# Patient Record
Sex: Female | Born: 1985 | Race: White | Hispanic: No | Marital: Single | Smoking: Current every day smoker
Health system: Southern US, Community
[De-identification: ages and names within clinical notes are randomized; demographics above are authoritative.]

## PROBLEM LIST (undated history)

## (undated) DIAGNOSIS — F419 Anxiety disorder, unspecified: Secondary | ICD-10-CM

## (undated) DIAGNOSIS — F32A Depression, unspecified: Secondary | ICD-10-CM

## (undated) DIAGNOSIS — F329 Major depressive disorder, single episode, unspecified: Secondary | ICD-10-CM

## (undated) DIAGNOSIS — I10 Essential (primary) hypertension: Secondary | ICD-10-CM

## (undated) DIAGNOSIS — F4310 Post-traumatic stress disorder, unspecified: Secondary | ICD-10-CM

## (undated) DIAGNOSIS — F909 Attention-deficit hyperactivity disorder, unspecified type: Secondary | ICD-10-CM

## (undated) DIAGNOSIS — F319 Bipolar disorder, unspecified: Secondary | ICD-10-CM

## (undated) HISTORY — PX: APPENDECTOMY: SHX54

## (undated) HISTORY — PX: CHOLECYSTECTOMY: SHX55

## (undated) HISTORY — DX: Essential (primary) hypertension: I10

---
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
## 2019-04-05 HISTORY — PX: WRIST FRACTURE SURGERY: SHX121

## 2019-07-15 ENCOUNTER — Emergency Department (HOSPITAL_COMMUNITY)
Admission: EM | Admit: 2019-07-15 | Discharge: 2019-07-15 | Disposition: A | Discharge status: Home / Self Care | Payer: Medicaid Other | Attending: Emergency Medicine | Admitting: Emergency Medicine

## 2019-07-15 ENCOUNTER — Encounter (HOSPITAL_COMMUNITY): Payer: Self-pay | Admitting: Emergency Medicine

## 2019-07-15 ENCOUNTER — Other Ambulatory Visit: Payer: Self-pay

## 2019-07-15 DIAGNOSIS — Z760 Encounter for issue of repeat prescription: Secondary | ICD-10-CM | POA: Diagnosis not present

## 2019-07-15 DIAGNOSIS — Z5321 Procedure and treatment not carried out due to patient leaving prior to being seen by health care provider: Secondary | ICD-10-CM | POA: Insufficient documentation

## 2019-07-15 HISTORY — DX: Anxiety disorder, unspecified: F41.9

## 2019-07-15 HISTORY — DX: Attention-deficit hyperactivity disorder, unspecified type: F90.9

## 2019-07-15 HISTORY — DX: Post-traumatic stress disorder, unspecified: F43.10

## 2019-07-15 HISTORY — DX: Depression, unspecified: F32.A

## 2019-07-15 NOTE — ED Triage Notes (Signed)
Pt. Stated, I need RX refill for Alteral, Xanax, Proozac. Ive had to cross over states and they don't cross over.

## 2019-07-15 NOTE — ED Notes (Signed)
Pt called out stating she has a curfew at the shelter. Pt advised the provider will be in as soon as they can. Pt stated she will wait for now.

## 2019-08-14 ENCOUNTER — Ambulatory Visit: Payer: Medicaid Other | Attending: Internal Medicine

## 2019-08-14 ENCOUNTER — Other Ambulatory Visit: Payer: Self-pay

## 2019-08-14 DIAGNOSIS — Z20822 Contact with and (suspected) exposure to covid-19: Secondary | ICD-10-CM

## 2019-08-15 LAB — SARS-COV-2, NAA 2 DAY TAT

## 2019-08-15 LAB — NOVEL CORONAVIRUS, NAA: SARS-CoV-2, NAA: NOT DETECTED

## 2019-08-26 ENCOUNTER — Emergency Department (HOSPITAL_COMMUNITY): Payer: Medicaid Other

## 2019-08-26 ENCOUNTER — Encounter (HOSPITAL_COMMUNITY): Payer: Self-pay | Admitting: *Deleted

## 2019-08-26 ENCOUNTER — Emergency Department (HOSPITAL_COMMUNITY)
Admission: EM | Admit: 2019-08-26 | Discharge: 2019-08-26 | Disposition: A | Discharge status: Home / Self Care | Payer: Medicaid Other | Attending: Emergency Medicine | Admitting: Emergency Medicine

## 2019-08-26 ENCOUNTER — Other Ambulatory Visit: Payer: Self-pay

## 2019-08-26 DIAGNOSIS — J069 Acute upper respiratory infection, unspecified: Secondary | ICD-10-CM | POA: Diagnosis not present

## 2019-08-26 DIAGNOSIS — Z20822 Contact with and (suspected) exposure to covid-19: Secondary | ICD-10-CM | POA: Insufficient documentation

## 2019-08-26 DIAGNOSIS — R05 Cough: Secondary | ICD-10-CM | POA: Diagnosis present

## 2019-08-26 MED ORDER — ALBUTEROL SULFATE HFA 108 (90 BASE) MCG/ACT IN AERS
2.0000 | INHALATION_SPRAY | Freq: Once | RESPIRATORY_TRACT | Status: AC
  Administered 2019-08-26: 2 via RESPIRATORY_TRACT
  Filled 2019-08-26: qty 6.7

## 2019-08-26 MED ORDER — PREDNISONE 20 MG PO TABS: 40.0000 mg | ORAL_TABLET | Freq: Every day | ORAL | 0 refills | Status: DC

## 2019-08-26 MED ORDER — AEROCHAMBER Z-STAT PLUS/MEDIUM MISC
1.0000 | Freq: Once | Status: AC
  Administered 2019-08-26: 1

## 2019-08-26 MED ORDER — BENZONATATE 200 MG PO CAPS
200.0000 mg | ORAL_CAPSULE | Freq: Three times a day (TID) | ORAL | 0 refills | Status: DC | PRN
Start: 2019-08-26 — End: 2019-11-19

## 2019-08-26 MED ORDER — BENZONATATE 100 MG PO CAPS
200.0000 mg | ORAL_CAPSULE | Freq: Once | ORAL | Status: AC
  Administered 2019-08-26: 200 mg via ORAL
  Filled 2019-08-26: qty 2

## 2019-08-26 MED ORDER — PREDNISONE 50 MG PO TABS
60.0000 mg | ORAL_TABLET | Freq: Once | ORAL | Status: AC
  Administered 2019-08-26: 60 mg via ORAL
  Filled 2019-08-26: qty 1

## 2019-08-26 MED ORDER — ACETAMINOPHEN 325 MG PO TABS
650.0000 mg | ORAL_TABLET | Freq: Once | ORAL | Status: AC
  Administered 2019-08-26: 650 mg via ORAL
  Filled 2019-08-26: qty 2

## 2019-08-26 NOTE — ED Notes (Addendum)
Pt reports being seen May 12 th treated for same symptoms.  Pt says she has being taking zyrtec (no relief) since 12 th.  Pt reports smoking one pack of cigarettes per day.  Pt was tested for COVID on 12 th, test negative.  Denies fever.  C/o pain at base of lungs when coughing.

## 2019-08-26 NOTE — ED Provider Notes (Signed)
   Patient signed out to me by Burgess Amor, PA-C pending completion of work up and reassement.   Patient here with 1 week history of rhinorrhea and cough.  Had a negative Covid test on 08/14/2019.  Found to have expiratory wheezes and was given prednisone and albuterol.  On my exam, lung sounds are clear to auscultation bilaterally.  No hypoxia or tachycardia.  Vital signs reviewed.  Portable 1 view of chest was concerning for possible retrocardiac opacity and a single, lateral view was recommended.  Lateral view of chest shows lung fields are clear without retrocardiac opacity.  Results reviewed by me.  Patient appears appropriate for discharge at this time.  Repeat Covid test is pending.  Low clinical suspicion for COVID-19.  Patient agrees to quarantine at home until results are back.  Return precautions discussed.    DG Chest 1 View  Result Date: 08/26/2019 CLINICAL DATA:  Cough EXAM: CHEST  1 VIEW COMPARISON:  08/26/2019 FINDINGS: Single lateral view of the chest. No persistent retrocardiac opacity. Lung fields are clear. IMPRESSION: Negative. Electronically Signed   By: Jasmine Pang M.D.   On: 08/26/2019 18:09   DG Chest Port 1 View  Result Date: 08/26/2019 CLINICAL DATA:  Persistent cough short of breath EXAM: PORTABLE CHEST 1 VIEW COMPARISON:  None. FINDINGS: The heart size and mediastinal contours are within normal limits. Both lungs are clear. Rounded retrocardiac opacity. IMPRESSION: No active disease. Rounded retrocardiac opacity could, possible hiatal hernia. Consider correlation with lateral view of the chest. Electronically Signed   By: Jasmine Pang M.D.   On: 08/26/2019 16:38      Pauline Aus, PA-C 08/26/19 1856    Sabas Sous, MD 09/03/19 [phone]

## 2019-08-26 NOTE — Discharge Instructions (Addendum)
1 to 2 puffs of your albuterol inhaler every 4-6 hours as needed.  Your Covid test is pending.  You will need to quarantine at home until your results are back.  You may review this in the MyChart app on your phone.  Follow-up with your primary doctor for recheck or return to the emergency department for any worsening symptoms.

## 2019-08-26 NOTE — ED Triage Notes (Signed)
Pt c/o cough and runny nose x 1 week. Pt reports she had Covid test done 2 days after her symptoms started last week and it came back negative. Pt reports her symptoms just keep progressing.

## 2019-08-27 LAB — SARS CORONAVIRUS 2 (TAT 6-24 HRS): SARS Coronavirus 2: NEGATIVE

## 2019-09-16 ENCOUNTER — Encounter: Payer: Self-pay | Admitting: Emergency Medicine

## 2019-09-16 ENCOUNTER — Ambulatory Visit
Admission: EM | Admit: 2019-09-16 | Discharge: 2019-09-16 | Disposition: A | Discharge status: Home / Self Care | Payer: Medicaid Other | Attending: Emergency Medicine | Admitting: Emergency Medicine

## 2019-09-16 DIAGNOSIS — R200 Anesthesia of skin: Secondary | ICD-10-CM | POA: Diagnosis not present

## 2019-09-16 DIAGNOSIS — R202 Paresthesia of skin: Secondary | ICD-10-CM | POA: Diagnosis not present

## 2019-09-16 MED ORDER — PREDNISONE 10 MG PO TABS: 20.0000 mg | ORAL_TABLET | Freq: Every day | ORAL | 0 refills | Status: DC

## 2019-09-16 MED ORDER — GABAPENTIN 100 MG PO CAPS
100.0000 mg | ORAL_CAPSULE | Freq: Three times a day (TID) | ORAL | 0 refills | Status: DC
Start: 2019-09-16 — End: 2020-09-16

## 2019-09-16 NOTE — ED Provider Notes (Signed)
[facility]    CSN: [id number] Arrival date & time: 09/16/19  0957      History   Chief Complaint Chief Complaint  Patient presents with  . Arm Pain    HPI Jill Mosley is a 34 y.o. female.   Who presents to the urgent care with a complaint of left arm pain for the past 2 weeks.  Recently reported numbness and tingling on hand.  Denies any precipitating event.  Described the pain as constant, tingling and achy.  Has tried OTC medication without relief.  Symptoms made worse with range of motion.  She denies similar symptoms in the past.  Denies chills, fever, nausea, vomiting, diarrhea, trauma, injury.  The history is provided by the patient. No language interpreter was used.  Arm Pain    Past Medical History:  Diagnosis Date  . ADHD   . Anxiety   . Depression   . PTSD (post-traumatic stress disorder)     There are no problems to display for this patient.   Past Surgical History:  Procedure Laterality Date  . APPENDECTOMY    . CHOLECYSTECTOMY      OB History   No obstetric history on file.      Home Medications    Prior to Admission medications   Medication Sig Start Date End Date Taking? Authorizing Provider  APAP-Pamabrom-Pyrilamine (PMS-FORMULA PO) Take 1 tablet by mouth once as needed (for pain).    [provider]  benzonatate (TESSALON) 200 MG capsule Take 1 capsule (200 mg total) by mouth 3 (three) times daily as needed for cough. Swallow whole, do not chew 08/26/19   Triplett, Tammy, PA-C  cetirizine (ZYRTEC) 10 MG tablet Take 10 mg by mouth daily.    [provider]  diphenhydrAMINE (BENADRYL) 25 MG tablet Take 25 mg by mouth daily as needed for allergies.    [provider]  FLUoxetine (PROZAC) 20 MG capsule Take 20 mg by mouth every morning. 08/12/19   [provider]  gabapentin (NEURONTIN) 100 MG capsule Take 1 capsule (100 mg total) by mouth 3 (three) times daily. 09/16/19   Aeisha Minarik, Zachery Dakins, FNP    predniSONE (DELTASONE) 10 MG tablet Take 2 tablets (20 mg total) by mouth daily. 09/16/19   Emily Forse, Zachery Dakins, FNP    Family History No family history on file.  Social History Social History   Tobacco Use  . Smoking status: Current Every Day Smoker    Packs/day: 1.00    Types: Cigarettes  . Smokeless tobacco: Never Used  Vaping Use  . Vaping Use: Never used  Substance Use Topics  . Alcohol use: Not Currently  . Drug use: Not Currently     Allergies   Elavil [amitriptyline] and Tramadol   Review of Systems Review of Systems  Constitutional: Negative.   Respiratory: Negative.   Cardiovascular: Negative.   Musculoskeletal: Positive for arthralgias.  Neurological: Positive for numbness.  All other systems reviewed and are negative.    Physical Exam Triage Vital Signs ED Triage Vitals  Enc Vitals Group     BP 09/16/19 1052 135/84     Pulse Rate 09/16/19 1052 76     Resp 09/16/19 1052 18     Temp 09/16/19 1052 97.9 F (36.6 C)     Temp Source 09/16/19 1052 Oral     SpO2 09/16/19 1052 97 %     Weight 09/16/19 1054 220 lb (99.8 kg)     Height 09/16/19 1054 5\' 9"  (1.753  m)     Head Circumference --      Peak Flow --      Pain Score 09/16/19 1055 8     Pain Loc --      Pain Edu? --      Excl. in [address]? --    No data found.  Updated Vital Signs BP 135/84 (BP Location: Right Arm)   Pulse 76   Temp 97.9 F (36.6 C) (Oral)   Resp 18   Ht 5\' 9"  (1.753 m)   Wt 220 lb (99.8 kg)   LMP 08/19/2019   SpO2 97%   BMI 32.49 kg/m   Visual Acuity Right Eye Distance:   Left Eye Distance:   Bilateral Distance:    Right Eye Near:   Left Eye Near:    Bilateral Near:     Physical Exam Vitals and nursing note reviewed.  Cardiovascular:     Rate and Rhythm: Normal rate and regular rhythm.     Pulses: Normal pulses.     Heart sounds: Normal heart sounds. No murmur heard.  No friction rub. No gallop.   Pulmonary:     Effort: Pulmonary effort is normal. No  respiratory distress.     Breath sounds: Normal breath sounds. No stridor. No wheezing, rhonchi or rales.  Chest:     Chest wall: No tenderness.  Musculoskeletal:        General: Tenderness present.  Neurological:     General: No focal deficit present.     Mental Status: She is alert and oriented to person, place, and time.     GCS: GCS eye subscore is 4. GCS verbal subscore is 5. GCS motor subscore is 6.     Cranial Nerves: Cranial nerves are intact. No cranial nerve deficit.     Sensory: Sensory deficit present.     Motor: No weakness.     Coordination: Coordination is intact. Coordination normal.     Gait: Gait is intact. Gait normal.     Deep Tendon Reflexes: Reflexes normal.     Reflex Scores:      Patellar reflexes are 2+ on the right side and 2+ on the left side.     UC Treatments / Results  Labs (all labs ordered are listed, but only abnormal results are displayed) Labs Reviewed - No data to display  EKG   Radiology No results found.  Procedures Procedures (including critical care time)  Medications Ordered in UC Medications - No data to display  Initial Impression / Assessment and Plan / UC Course  I have reviewed the triage vital signs and the nursing notes.  Pertinent labs & imaging results that were available during my care of the patient were reviewed by me and considered in my medical decision making (see chart for details).    Patient is stable at discharge.  Symptom is likely from cervical radiculopathy.  Will prescribe prednisone and gabapentin.  PCP resource was provided.  Final Clinical Impressions(s) / UC Diagnoses   Final diagnoses:  Numbness and tingling in left arm   Discharge Instructions   None    ED Prescriptions    Medication Sig Dispense Auth. Provider   predniSONE (DELTASONE) 10 MG tablet Take 2 tablets (20 mg total) by mouth daily. 15 tablet Mellina Benison, Darrelyn Hillock, FNP   gabapentin (NEURONTIN) 100 MG capsule Take 1 capsule (100 mg  total) by mouth 3 (three) times daily. 30 capsule Sakoya Win, Darrelyn Hillock, FNP     PDMP not reviewed  this encounter.   Durward Parcel, FNP 09/16/19 1154

## 2019-09-16 NOTE — Discharge Instructions (Addendum)
Gabapentin was prescribed take as directed Prednisone was prescribed PCP resource was provided/please call Return or go to ED for worsening symptoms

## 2019-09-16 NOTE — ED Triage Notes (Addendum)
Pt started a new job recently where she has to have her arms up and moving a lot.  Pain to hand that radiates up to shoulder, numbness and tingling to LT hand on and off x2 weeks.  Pt states she needs a work note for today because she couldn't go in d/t the pain.

## 2019-09-19 ENCOUNTER — Emergency Department (HOSPITAL_COMMUNITY): Payer: Medicaid Other

## 2019-09-19 ENCOUNTER — Other Ambulatory Visit: Payer: Self-pay

## 2019-09-19 ENCOUNTER — Emergency Department (HOSPITAL_COMMUNITY)
Admission: EM | Admit: 2019-09-19 | Discharge: 2019-09-19 | Disposition: A | Discharge status: Home / Self Care | Payer: Medicaid Other | Attending: Emergency Medicine | Admitting: Emergency Medicine

## 2019-09-19 ENCOUNTER — Encounter (HOSPITAL_COMMUNITY): Payer: Self-pay | Admitting: Emergency Medicine

## 2019-09-19 DIAGNOSIS — Y939 Activity, unspecified: Secondary | ICD-10-CM | POA: Insufficient documentation

## 2019-09-19 DIAGNOSIS — S39012A Strain of muscle, fascia and tendon of lower back, initial encounter: Secondary | ICD-10-CM

## 2019-09-19 DIAGNOSIS — Y999 Unspecified external cause status: Secondary | ICD-10-CM | POA: Diagnosis not present

## 2019-09-19 DIAGNOSIS — Y9289 Other specified places as the place of occurrence of the external cause: Secondary | ICD-10-CM | POA: Diagnosis not present

## 2019-09-19 DIAGNOSIS — Z79899 Other long term (current) drug therapy: Secondary | ICD-10-CM | POA: Diagnosis not present

## 2019-09-19 DIAGNOSIS — M5412 Radiculopathy, cervical region: Secondary | ICD-10-CM

## 2019-09-19 DIAGNOSIS — F17210 Nicotine dependence, cigarettes, uncomplicated: Secondary | ICD-10-CM | POA: Insufficient documentation

## 2019-09-19 DIAGNOSIS — W108XXA Fall (on) (from) other stairs and steps, initial encounter: Secondary | ICD-10-CM | POA: Insufficient documentation

## 2019-09-19 DIAGNOSIS — F909 Attention-deficit hyperactivity disorder, unspecified type: Secondary | ICD-10-CM | POA: Diagnosis not present

## 2019-09-19 DIAGNOSIS — S3992XA Unspecified injury of lower back, initial encounter: Secondary | ICD-10-CM | POA: Diagnosis present

## 2019-09-19 LAB — I-STAT BETA HCG BLOOD, ED (MC, WL, AP ONLY): I-stat hCG, quantitative: <5 m[IU]/mL (ref <5)

## 2019-09-19 MED ORDER — HYDROCODONE-ACETAMINOPHEN 5-325 MG PO TABS
1.0000 | ORAL_TABLET | Freq: Once | ORAL | Status: AC
  Administered 2019-09-19: 1 via ORAL
  Filled 2019-09-19: qty 1

## 2019-09-19 MED ORDER — HYDROCODONE-ACETAMINOPHEN 5-325 MG PO TABS: 1.0000 | ORAL_TABLET | Freq: Four times a day (QID) | ORAL | 0 refills | Status: DC | PRN

## 2019-09-19 NOTE — ED Notes (Signed)
Pt given ice and crackers.

## 2019-09-19 NOTE — Discharge Instructions (Signed)
Follow-up with neurology probably for the pinched nerve in the left side of your neck resulting in the arm pain.  X-rays of the low back showed no bony injuries from your fall.  Take the hydrocodone as needed for pain.  Continue your other medications.  Continue the prednisone for sure.

## 2019-09-19 NOTE — ED Triage Notes (Addendum)
Pt c/o of upper left arm, neck and back pain x 2 months. Pt states she needs pain meds. Pt ambulatory from WR to room

## 2019-09-19 NOTE — ED Notes (Signed)
Pt transported to xray 

## 2019-09-19 NOTE — ED Provider Notes (Signed)
[facility] EMERGENCY DEPARTMENT Provider Note   CSN: [id number] Arrival date & time: 09/19/19  0809     History Chief Complaint  Patient presents with  . Back Pain    Jill Mosley is a 34 y.o. female.  Patient with a complaint of low back pain since a fall on the stairs in May.  She is also had the complaint of her left arm with some discomfort that radiates to to the shoulder and numbness and tingling in the left hand on and off for 2 weeks.  Was seen at urgent care recently for that on June 14 and started on prednisone and Neurontin.  Patient denies any numbness or weakness to her lower extremities and no incontinence.  Her main concern today is the back pain.        Past Medical History:  Diagnosis Date  . ADHD   . Anxiety   . Depression   . PTSD (post-traumatic stress disorder)     There are no problems to display for this patient.   Past Surgical History:  Procedure Laterality Date  . APPENDECTOMY    . CHOLECYSTECTOMY       OB History   No obstetric history on file.     History reviewed. No pertinent family history.  Social History   Tobacco Use  . Smoking status: Current Every Day Smoker    Packs/day: 1.00    Types: Cigarettes  . Smokeless tobacco: Never Used  Vaping Use  . Vaping Use: Never used  Substance Use Topics  . Alcohol use: Not Currently  . Drug use: Not Currently    Home Medications Prior to Admission medications   Medication Sig Start Date End Date Taking? Authorizing Provider  Acetaminophen (TYLENOL PO) Take 2 tablets by mouth daily as needed (pain).   Yes [provider]  cetirizine (ZYRTEC) 10 MG tablet Take 10 mg by mouth daily.   Yes [provider]  diphenhydrAMINE (BENADRYL) 25 MG tablet Take 25 mg by mouth daily as needed for allergies.   Yes [provider]  FLUoxetine (PROZAC) 20 MG capsule Take 20 mg by mouth every morning. 08/12/19  Yes [provider]  gabapentin (NEURONTIN) 100 MG  capsule Take 1 capsule (100 mg total) by mouth 3 (three) times daily. 09/16/19  Yes Avegno, Zachery Dakins, FNP  predniSONE (DELTASONE) 10 MG tablet Take 2 tablets (20 mg total) by mouth daily. 09/16/19  Yes Avegno, Zachery Dakins, FNP  APAP-Pamabrom-Pyrilamine (PMS-FORMULA PO) Take 1 tablet by mouth once as needed (for pain). Patient not taking: Reported on 09/19/2019    [provider]  benzonatate (TESSALON) 200 MG capsule Take 1 capsule (200 mg total) by mouth 3 (three) times daily as needed for cough. Swallow whole, do not chew Patient not taking: Reported on 09/19/2019 08/26/19   Triplett, Babette Relic, PA-C  HYDROcodone-acetaminophen (NORCO/VICODIN) 5-325 MG tablet Take 1 tablet by mouth every 6 (six) hours as needed. 09/19/19   Vanetta Mulders, MD    Allergies    Elavil [amitriptyline] and Tramadol  Review of Systems   Review of Systems  Constitutional: Negative for chills and fever.  HENT: Negative for congestion, rhinorrhea and sore throat.   Eyes: Negative for visual disturbance.  Respiratory: Negative for cough and shortness of breath.   Cardiovascular: Negative for chest pain and leg swelling.  Gastrointestinal: Negative for abdominal pain, diarrhea, nausea and vomiting.  Genitourinary: Negative for dysuria.  Musculoskeletal: Positive for back pain. Negative for neck pain.  Skin: Negative  for rash.  Neurological: Negative for dizziness, light-headedness and headaches.  Hematological: Does not bruise/bleed easily.  Psychiatric/Behavioral: Negative for confusion.    Physical Exam Updated Vital Signs BP (!) 153/105   Pulse 94   Temp 99 F (37.2 C)   Resp 18   Ht 1.753 m (5\' 9" )   Wt 99.8 kg   SpO2 99%   BMI 32.49 kg/m   Physical Exam Vitals and nursing note reviewed.  Constitutional:      General: She is not in acute distress.    Appearance: Normal appearance. She is well-developed.  HENT:     Head: Normocephalic and atraumatic.  Eyes:     Extraocular Movements:  Extraocular movements intact.     Conjunctiva/sclera: Conjunctivae normal.     Pupils: Pupils are equal, round, and reactive to light.  Cardiovascular:     Rate and Rhythm: Normal rate and regular rhythm.     Heart sounds: No murmur heard.   Pulmonary:     Effort: Pulmonary effort is normal. No respiratory distress.     Breath sounds: Normal breath sounds.  Abdominal:     Palpations: Abdomen is soft.     Tenderness: There is no abdominal tenderness.  Musculoskeletal:        General: No swelling. Normal range of motion.     Cervical back: Normal range of motion and neck supple.     Comments: Dorsalis pedis pulse is 2+ in both feet.  Left arm radial pulses 2+.  Sensation currently intact motor strength intact in left upper extremity.  No weakness or sensory deficit to both feet.  Skin:    General: Skin is warm and dry.     Capillary Refill: Capillary refill takes less than 2 seconds.  Neurological:     General: No focal deficit present.     Mental Status: She is alert and oriented to person, place, and time.     Cranial Nerves: No cranial nerve deficit.     Sensory: No sensory deficit.     Motor: No weakness.     ED Results / Procedures / Treatments   Labs (all labs ordered are listed, but only abnormal results are displayed) Labs Reviewed  I-STAT BETA HCG BLOOD, ED ([facility], [facility], [facility] ONLY)    EKG None  Radiology DG Lumbar Spine Complete  Result Date: 09/19/2019 CLINICAL DATA:  Low back pain after fall 1.5 months ago EXAM: [place] 4+ VIEW COMPARISON:  None. FINDINGS: There is no evidence of lumbar spine fracture. Alignment is normal. Intervertebral disc spaces are maintained. IMPRESSION: Negative. Electronically Signed   By: Davina Poke D.O.   On: 09/19/2019 11:38    Procedures Procedures (including critical care time)  Medications Ordered in ED Medications  HYDROcodone-acetaminophen (NORCO/VICODIN) 5-325 MG per tablet 1 tablet (1 tablet Oral Given  09/19/19 1025)    ED Course  I have reviewed the triage vital signs and the nursing notes.  Pertinent labs & imaging results that were available during my care of the patient were reviewed by me and considered in my medical decision making (see chart for details).    MDM Rules/Calculators/A&P                           X-ray of the lumbar back without any acute abnormalities this was done because of the fall in May.  No evidence of any sciatica to the lower extremities.  Patient symptoms to her  left  arm are consistent with cervical radiculopathy.  Will refer patient to neurology for that.  Patient already on prednisone and Neurontin.  Will add on hydrocodone for pain control.  Patient given hydrocodone here with some improvement.   Final Clinical Impression(s) / ED Diagnoses Final diagnoses:  Strain of lumbar region, initial encounter  Cervical radiculopathy    Rx / DC Orders ED Discharge Orders         Ordered    HYDROcodone-acetaminophen (NORCO/VICODIN) 5-325 MG tablet  Every 6 hours PRN     Discontinue  Reprint     09/19/19 1457           Vanetta Mulders, MD 09/19/19 [phone]

## 2019-10-07 ENCOUNTER — Emergency Department (HOSPITAL_COMMUNITY)
Admission: EM | Admit: 2019-10-07 | Discharge: 2019-10-07 | Disposition: A | Discharge status: Home / Self Care | Payer: Medicaid Other | Attending: Emergency Medicine | Admitting: Emergency Medicine

## 2019-10-07 ENCOUNTER — Encounter (HOSPITAL_COMMUNITY): Payer: Self-pay | Admitting: Emergency Medicine

## 2019-10-07 DIAGNOSIS — Y93E5 Activity, floor mopping and cleaning: Secondary | ICD-10-CM | POA: Diagnosis not present

## 2019-10-07 DIAGNOSIS — M545 Low back pain: Secondary | ICD-10-CM | POA: Diagnosis present

## 2019-10-07 DIAGNOSIS — Z79899 Other long term (current) drug therapy: Secondary | ICD-10-CM | POA: Diagnosis not present

## 2019-10-07 DIAGNOSIS — F17210 Nicotine dependence, cigarettes, uncomplicated: Secondary | ICD-10-CM | POA: Insufficient documentation

## 2019-10-07 DIAGNOSIS — S39012A Strain of muscle, fascia and tendon of lower back, initial encounter: Secondary | ICD-10-CM | POA: Diagnosis not present

## 2019-10-07 DIAGNOSIS — Y999 Unspecified external cause status: Secondary | ICD-10-CM | POA: Insufficient documentation

## 2019-10-07 DIAGNOSIS — X500XXA Overexertion from strenuous movement or load, initial encounter: Secondary | ICD-10-CM | POA: Insufficient documentation

## 2019-10-07 DIAGNOSIS — Y92039 Unspecified place in apartment as the place of occurrence of the external cause: Secondary | ICD-10-CM | POA: Insufficient documentation

## 2019-10-07 DIAGNOSIS — S39012D Strain of muscle, fascia and tendon of lower back, subsequent encounter: Secondary | ICD-10-CM

## 2019-10-07 MED ORDER — MELOXICAM 7.5 MG PO TABS: 7.5000 mg | ORAL_TABLET | Freq: Every day | ORAL | 0 refills | Status: AC

## 2019-10-07 MED ORDER — KETOROLAC TROMETHAMINE 30 MG/ML IJ SOLN
30.0000 mg | Freq: Once | INTRAMUSCULAR | Status: AC
  Administered 2019-10-07: 30 mg via INTRAMUSCULAR
  Filled 2019-10-07: qty 1

## 2019-10-07 MED ORDER — CYCLOBENZAPRINE HCL 10 MG PO TABS
10.0000 mg | ORAL_TABLET | Freq: Once | ORAL | Status: AC
  Administered 2019-10-07: 10 mg via ORAL
  Filled 2019-10-07: qty 1

## 2019-10-07 MED ORDER — METHOCARBAMOL 500 MG PO TABS
500.0000 mg | ORAL_TABLET | Freq: Two times a day (BID) | ORAL | 0 refills | Status: DC
Start: 2019-10-07 — End: 2020-09-16

## 2019-10-07 NOTE — ED Triage Notes (Signed)
Pt. Stated, I was cleaning house and bending over and my back would not straightened up. And my left shoulder is in pain.

## 2019-10-07 NOTE — ED Provider Notes (Signed)
[facility] EMERGENCY DEPARTMENT Provider Note   CSN: [id number] Arrival date & time: 10/07/19  1115     History Chief Complaint  Patient presents with   Back Pain   Shoulder Pain    Jill Mosley is a 34 y.o. female.  34 year old female presents with complaint of pain in her low back.  Patient states that she bent over to sweep on July 3 and developed sharp pain across her back, has been unable to stand up straight since that time.  Patient states that she has had 4 epidurals in the past and has had back pain since her epidurals, nothing recently, denies falls or injuries otherwise.  Patient was seen by her PCP for pain in her left shoulder radiating down her arm who prescribed Neurontin, she is taking this without improvement of her back pain.  She denies changes in bowel or bladder habits, abdominal pain, leg weakness or numbness.  Pain does not radiate anywhere, is worse with movement or palpation.  No other complaints or concerns today.        Past Medical History:  Diagnosis Date   ADHD    Anxiety    Depression    PTSD (post-traumatic stress disorder)     There are no problems to display for this patient.   Past Surgical History:  Procedure Laterality Date   APPENDECTOMY     CHOLECYSTECTOMY       OB History   No obstetric history on file.     No family history on file.  Social History   Tobacco Use   Smoking status: Current Every Day Smoker    Packs/day: 1.00    Types: Cigarettes   Smokeless tobacco: Never Used  Vaping Use   Vaping Use: Never used  Substance Use Topics   Alcohol use: Not Currently   Drug use: Not Currently    Home Medications Prior to Admission medications   Medication Sig Start Date End Date Taking? Authorizing Provider  Acetaminophen (TYLENOL PO) Take 2 tablets by mouth daily as needed (pain).    [provider]  APAP-Pamabrom-Pyrilamine (PMS-FORMULA PO) Take 1 tablet by mouth once as  needed (for pain). Patient not taking: Reported on 09/19/2019    [provider]  benzonatate (TESSALON) 200 MG capsule Take 1 capsule (200 mg total) by mouth 3 (three) times daily as needed for cough. Swallow whole, do not chew Patient not taking: Reported on 09/19/2019 08/26/19   Triplett, Tammy, PA-C  cetirizine (ZYRTEC) 10 MG tablet Take 10 mg by mouth daily.    [provider]  diphenhydrAMINE (BENADRYL) 25 MG tablet Take 25 mg by mouth daily as needed for allergies.    [provider]  FLUoxetine (PROZAC) 20 MG capsule Take 20 mg by mouth every morning. 08/12/19   [provider]  gabapentin (NEURONTIN) 100 MG capsule Take 1 capsule (100 mg total) by mouth 3 (three) times daily. 09/16/19   Avegno, Zachery Dakins, FNP  meloxicam (MOBIC) 7.5 MG tablet Take 1 tablet (7.5 mg total) by mouth daily for 10 days. 10/07/19 10/17/19  Jeannie Fend, PA-C  methocarbamol (ROBAXIN) 500 MG tablet Take 1 tablet (500 mg total) by mouth 2 (two) times daily. 10/07/19   Jeannie Fend, PA-C    Allergies    Elavil [amitriptyline] and Tramadol  Review of Systems   Review of Systems  Constitutional: Negative for fever.  Gastrointestinal: Negative for abdominal pain, constipation and diarrhea.  Genitourinary: Negative for decreased urine  volume and difficulty urinating.  Musculoskeletal: Positive for back pain.  Skin: Negative for rash and wound.  Allergic/Immunologic: Negative for immunocompromised state.  Neurological: Negative for weakness and numbness.  All other systems reviewed and are negative.   Physical Exam Updated Vital Signs BP (!) 153/104 (BP Location: Right Arm)    Pulse 80    Temp 98.5 F (36.9 C) (Oral)    Resp (!) 22    LMP 09/12/2019    SpO2 98%   Physical Exam Vitals and nursing note reviewed.  Constitutional:      General: She is not in acute distress.    Appearance: She is well-developed. She is not diaphoretic.  HENT:     Head: Normocephalic and  atraumatic.  Cardiovascular:     Pulses: Normal pulses.  Pulmonary:     Effort: Pulmonary effort is normal.  Abdominal:     Palpations: Abdomen is soft.     Tenderness: There is no abdominal tenderness.  Musculoskeletal:        General: Tenderness present.     Right lower leg: No edema.     Left lower leg: No edema.     Comments: Tenderness to diffuse back, no step offs or crepitus, no palpable spasm. No skin changes.   Skin:    General: Skin is warm and dry.     Findings: No erythema or rash.  Neurological:     Mental Status: She is alert and oriented to person, place, and time.     Deep Tendon Reflexes: Babinski sign absent on the right side. Babinski sign absent on the left side.     Reflex Scores:      Patellar reflexes are 1+ on the right side and 1+ on the left side.      Achilles reflexes are 1+ on the right side and 1+ on the left side. Psychiatric:        Behavior: Behavior normal.     ED Results / Procedures / Treatments   Labs (all labs ordered are listed, but only abnormal results are displayed) Labs Reviewed - No data to display  EKG None  Radiology No results found.  Procedures Procedures (including critical care time)  Medications Ordered in ED Medications  ketorolac (TORADOL) 30 MG/ML injection 30 mg (30 mg Intramuscular Given 10/07/19 1153)  cyclobenzaprine (FLEXERIL) tablet 10 mg (10 mg Oral Given 10/07/19 1152)    ED Course  I have reviewed the triage vital signs and the nursing notes.  Pertinent labs & imaging results that were available during my care of the patient were reviewed by me and considered in my medical decision making (see chart for details).  Clinical Course as of Oct 07 1346  Mon Oct 07, 2019  504 34 year old female with complaint of low back pain.  On exam found to have diffuse generalized back pain without step-offs or crepitus. Does have slight discomfort on the left side of her back with straight leg raise on the left. Patient  was given Toradol and Flexeril in the ER with plan to discharge with Robaxin and meloxicam. Recommend warm compresses, given gentle exercises. Discussed importance of core strength and importance of follow-up with PCP for physical therapy for long-term back health.   [LM]    Clinical Course User Index [LM] Alden Hipp   MDM Rules/Calculators/A&P                          Final Clinical  Impression(s) / ED Diagnoses Final diagnoses:  Strain of lumbar region, subsequent encounter    Rx / DC Orders ED Discharge Orders         Ordered    methocarbamol (ROBAXIN) 500 MG tablet  2 times daily     Discontinue  Reprint     10/07/19 1138    meloxicam (MOBIC) 7.5 MG tablet  Daily     Discontinue  Reprint     10/07/19 1138           Jeannie Fend, PA-C 10/07/19 1348    Melene Plan, DO 10/07/19 1401

## 2019-10-07 NOTE — Discharge Instructions (Addendum)
Warm compresses for 20 minutes. Take medications as prescribed. Follow up with your doctor.

## 2019-11-19 ENCOUNTER — Emergency Department (HOSPITAL_COMMUNITY)
Admission: EM | Admit: 2019-11-19 | Discharge: 2019-11-19 | Disposition: A | Discharge status: Home / Self Care | Payer: Medicaid Other

## 2019-11-19 ENCOUNTER — Ambulatory Visit
Admission: EM | Admit: 2019-11-19 | Discharge: 2019-11-19 | Disposition: A | Discharge status: Home / Self Care | Payer: Medicaid Other | Attending: Emergency Medicine | Admitting: Emergency Medicine

## 2019-11-19 ENCOUNTER — Other Ambulatory Visit: Payer: Self-pay

## 2019-11-19 DIAGNOSIS — J069 Acute upper respiratory infection, unspecified: Secondary | ICD-10-CM | POA: Diagnosis not present

## 2019-11-19 DIAGNOSIS — R519 Headache, unspecified: Secondary | ICD-10-CM

## 2019-11-19 DIAGNOSIS — R21 Rash and other nonspecific skin eruption: Secondary | ICD-10-CM

## 2019-11-19 DIAGNOSIS — Z20822 Contact with and (suspected) exposure to covid-19: Secondary | ICD-10-CM

## 2019-11-19 MED ORDER — KETOROLAC TROMETHAMINE 60 MG/2ML IM SOLN
60.0000 mg | Freq: Once | INTRAMUSCULAR | Status: AC
  Administered 2019-11-19: 60 mg via INTRAMUSCULAR

## 2019-11-19 NOTE — ED Provider Notes (Signed)
[facility]   [id number] 11/19/19 Arrival Time: 1419   CC: COVID symptoms  SUBJECTIVE: History from: patient.  Jill Mosley is a 34 y.o. female who presents with neck pain, sore throat and headache x 1 day.  Admits to [facility] exposure.  Has tried OTC medications without relief.  Denies aggravating factors.  Denies previous symptoms in the past.   Complains of itchy rash to chest as well.  Denies fever, rhinorrhea,  SOB, wheezing, chest pain, nausea, changes in bowel or bladder habits.    ROS: As per HPI.  All other pertinent ROS negative.     Past Medical History:  Diagnosis Date  . ADHD   . Anxiety   . Depression   . PTSD (post-traumatic stress disorder)    Past Surgical History:  Procedure Laterality Date  . APPENDECTOMY    . CHOLECYSTECTOMY     Allergies  Allergen Reactions  . Elavil [Amitriptyline] Other (See Comments)    "Can't stay out of the dreams"  . Tramadol Rash   No current facility-administered medications on file prior to encounter.   Current Outpatient Medications on File Prior to Encounter  Medication Sig Dispense Refill  . Acetaminophen (TYLENOL PO) Take 2 tablets by mouth daily as needed (pain).    . cetirizine (ZYRTEC) 10 MG tablet Take 10 mg by mouth daily.    . diphenhydrAMINE (BENADRYL) 25 MG tablet Take 25 mg by mouth daily as needed for allergies.    Marland Kitchen FLUoxetine (PROZAC) 20 MG capsule Take 20 mg by mouth every morning.    . gabapentin (NEURONTIN) 100 MG capsule Take 1 capsule (100 mg total) by mouth 3 (three) times daily. 30 capsule 0  . methocarbamol (ROBAXIN) 500 MG tablet Take 1 tablet (500 mg total) by mouth 2 (two) times daily. 20 tablet 0   Social History   Socioeconomic History  . Marital status: Single    Spouse name: Not on file  . Number of children: Not on file  . Years of education: Not on file  . Highest education level: Not on file  Occupational History  . Not on file  Tobacco Use  . Smoking status: Current Every Day  Smoker    Packs/day: 1.00    Types: Cigarettes  . Smokeless tobacco: Never Used  Vaping Use  . Vaping Use: Never used  Substance and Sexual Activity  . Alcohol use: Not Currently  . Drug use: Not Currently  . Sexual activity: Not on file  Other Topics Concern  . Not on file  Social History Narrative  . Not on file   Social Determinants of Health   Financial Resource Strain:   . Difficulty of Paying Living Expenses:   Food Insecurity:   . Worried About Programme researcher, broadcasting/film/video in the Last Year:   . Barista in the Last Year:   Transportation Needs:   . Freight forwarder (Medical):   Marland Kitchen Lack of Transportation (Non-Medical):   Physical Activity:   . Days of Exercise per Week:   . Minutes of Exercise per Session:   Stress:   . Feeling of Stress :   Social Connections:   . Frequency of Communication with Friends and Family:   . Frequency of Social Gatherings with Friends and Family:   . Attends Religious Services:   . Active Member of Clubs or Organizations:   . Attends Banker Meetings:   Marland Kitchen Marital Status:   Intimate Partner Violence:   .  Fear of Current or Ex-Partner:   . Emotionally Abused:   Marland Kitchen Physically Abused:   . Sexually Abused:    History reviewed. No pertinent family history.  OBJECTIVE:  Vitals:   11/19/19 1431  BP: 138/78  Pulse: 74  Resp: 20  Temp: (!) 97.5 F (36.4 C)  SpO2: 98%     General appearance: alert; appears fatigued, but nontoxic; speaking in full sentences and tolerating own secretions HEENT: NCAT; Ears: EACs clear, TMs pearly gray; Eyes: PERRL.  EOM grossly intact. Nose: nares patent without rhinorrhea, Throat: oropharynx clear, tonsils non erythematous or enlarged, uvula midline  Neck: TTP over posterior lymph nodes; no palpable lymph nodes; chin to chest without difficulty Lungs: unlabored respirations, symmetrical air entry; cough: mild; no respiratory distress; CTAB Heart: regular rate and rhythm.   Skin: warm  and dry; 3-4 erythematous papules/ pustules to anterior chest, no bleeding discharge Psychological: alert and cooperative; normal mood and affect   ASSESSMENT & PLAN:  1. Viral URI   2. Acute nonintractable headache, unspecified headache type   3. Rash and nonspecific skin eruption   4. Suspected COVID-19 virus infection     Meds ordered this encounter  Medications  . ketorolac (TORADOL) injection 60 mg    Symptoms could be early [facility] Toradol shot given in office COVID testing ordered.  It will take between 2-5 days for test results.  Someone will contact you regarding abnormal results.    In the meantime: You should remain isolated in your home for 10 days from symptom onset AND greater than 72 hours after symptoms resolution (absence of fever without the use of fever-reducing medication and improvement in respiratory symptoms), whichever is longer Get plenty of rest and push fluids Use OTC zyrtec for nasal congestion, runny nose, and/or sore throat Use OTC flonase for nasal congestion and runny nose Use medications daily for symptom relief Use OTC medications like ibuprofen or tylenol as needed fever or pain Call or go to the ED if you have any new or worsening symptoms such as fever, worsening cough, shortness of breath, chest tightness, chest pain, turning blue, changes in mental status, etc...   Reviewed expectations re: course of current medical issues. Questions answered. Outlined signs and symptoms indicating need for more acute intervention. Patient verbalized understanding. After Visit Summary given.         [name], PA-C 11/19/19 [phone]

## 2019-11-19 NOTE — ED Triage Notes (Signed)
Developed rash and headache 2 days ago, exposure to [facility]

## 2019-11-19 NOTE — Discharge Instructions (Signed)
Symptoms could be early [facility]FM Toradol shot given in office COVID testing ordered.  It will take between 2-5 days for test results.  Someone will contact you regarding abnormal results.    In the meantime: You should remain isolated in your home for 10 days from symptom onset AND greater than 72 hours after symptoms resolution (absence of fever without the use of fever-reducing medication and improvement in respiratory symptoms), whichever is longer Get plenty of rest and push fluids Use OTC zyrtec for nasal congestion, runny nose, and/or sore throat Use OTC flonase for nasal congestion and runny nose Use medications daily for symptom relief Use OTC medications like ibuprofen or tylenol as needed fever or pain Call or go to the ED if you have any new or worsening symptoms such as fever, worsening cough, shortness of breath, chest tightness, chest pain, turning blue, changes in mental status, etc..Marland Kitchen

## 2019-11-20 LAB — SARS-COV-2, NAA 2 DAY TAT

## 2019-11-20 LAB — NOVEL CORONAVIRUS, NAA: SARS-CoV-2, NAA: NOT DETECTED

## 2019-12-04 ENCOUNTER — Ambulatory Visit
Admission: EM | Admit: 2019-12-04 | Discharge: 2019-12-04 | Disposition: A | Discharge status: Home / Self Care | Payer: Medicaid Other | Attending: Emergency Medicine | Admitting: Emergency Medicine

## 2019-12-04 ENCOUNTER — Other Ambulatory Visit: Payer: Self-pay

## 2019-12-04 DIAGNOSIS — Z1152 Encounter for screening for COVID-19: Secondary | ICD-10-CM

## 2019-12-04 NOTE — ED Triage Notes (Signed)
covid exposure , no symptoms  °

## 2019-12-06 LAB — NOVEL CORONAVIRUS, NAA: SARS-CoV-2, NAA: NOT DETECTED

## 2019-12-07 ENCOUNTER — Ambulatory Visit
Admission: EM | Admit: 2019-12-07 | Discharge: 2019-12-07 | Disposition: A | Discharge status: Home / Self Care | Payer: Medicaid Other | Attending: Emergency Medicine | Admitting: Emergency Medicine

## 2019-12-07 ENCOUNTER — Other Ambulatory Visit: Payer: Self-pay

## 2019-12-07 DIAGNOSIS — S61012A Laceration without foreign body of left thumb without damage to nail, initial encounter: Secondary | ICD-10-CM

## 2019-12-07 MED ORDER — MUPIROCIN 2 % EX OINT
1.0000 | TOPICAL_OINTMENT | Freq: Two times a day (BID) | CUTANEOUS | 1 refills | Status: DC
Start: 2019-12-07 — End: 2020-09-16

## 2019-12-07 MED ORDER — MELOXICAM 15 MG PO TABS
15.0000 mg | ORAL_TABLET | Freq: Every day | ORAL | 0 refills | Status: DC
Start: 2019-12-07 — End: 2020-09-16

## 2019-12-07 NOTE — ED Triage Notes (Signed)
Pt has superficial laceration to left thumb after pyrex dish broke , no bleeding, mainly puncture wound may have glass in thumb

## 2019-12-07 NOTE — ED Provider Notes (Signed)
[facility]   [id number] 12/07/19 Arrival Time: 0855  CC: LACERATION  SUBJECTIVE:  Jill Mosley is a 34 y.o. female who presents with a laceration to LT thumb x 1 day.  Symptoms began after cutting thumb on a broken dish in the sink.  Reports constant throbbing pain.  Bleeding controlled.  Currently not on blood thinners.  Denies similar symptoms in the past.  Denies fever, chills, nausea, vomiting, redness, swelling, purulent drainage, decrease strength or sensation.   ROS: As per HPI.  All other pertinent ROS negative.     Past Medical History:  Diagnosis Date  . ADHD   . Anxiety   . Depression   . PTSD (post-traumatic stress disorder)    Past Surgical History:  Procedure Laterality Date  . APPENDECTOMY    . CHOLECYSTECTOMY     Allergies  Allergen Reactions  . Elavil [Amitriptyline] Other (See Comments)    "Can't stay out of the dreams"  . Tramadol Rash   No current facility-administered medications on file prior to encounter.   Current Outpatient Medications on File Prior to Encounter  Medication Sig Dispense Refill  . Acetaminophen (TYLENOL PO) Take 2 tablets by mouth daily as needed (pain).    . cetirizine (ZYRTEC) 10 MG tablet Take 10 mg by mouth daily.    . diphenhydrAMINE (BENADRYL) 25 MG tablet Take 25 mg by mouth daily as needed for allergies.    Marland Kitchen FLUoxetine (PROZAC) 20 MG capsule Take 20 mg by mouth every morning.    . gabapentin (NEURONTIN) 100 MG capsule Take 1 capsule (100 mg total) by mouth 3 (three) times daily. 30 capsule 0  . methocarbamol (ROBAXIN) 500 MG tablet Take 1 tablet (500 mg total) by mouth 2 (two) times daily. 20 tablet 0   Social History   Socioeconomic History  . Marital status: Single    Spouse name: Not on file  . Number of children: Not on file  . Years of education: Not on file  . Highest education level: Not on file  Occupational History  . Not on file  Tobacco Use  . Smoking status: Current Every Day Smoker     Packs/day: 1.00    Types: Cigarettes  . Smokeless tobacco: Never Used  Vaping Use  . Vaping Use: Never used  Substance and Sexual Activity  . Alcohol use: Not Currently  . Drug use: Not Currently  . Sexual activity: Not on file  Other Topics Concern  . Not on file  Social History Narrative  . Not on file   Social Determinants of Health   Financial Resource Strain:   . Difficulty of Paying Living Expenses: Not on file  Food Insecurity:   . Worried About Programme researcher, broadcasting/film/video in the Last Year: Not on file  . Ran Out of Food in the Last Year: Not on file  Transportation Needs:   . Lack of Transportation (Medical): Not on file  . Lack of Transportation (Non-Medical): Not on file  Physical Activity:   . Days of Exercise per Week: Not on file  . Minutes of Exercise per Session: Not on file  Stress:   . Feeling of Stress : Not on file  Social Connections:   . Frequency of Communication with Friends and Family: Not on file  . Frequency of Social Gatherings with Friends and Family: Not on file  . Attends Religious Services: Not on file  . Active Member of Clubs or Organizations: Not on file  . Attends Club  or Organization Meetings: Not on file  . Marital Status: Not on file  Intimate Partner Violence:   . Fear of Current or Ex-Partner: Not on file  . Emotionally Abused: Not on file  . Physically Abused: Not on file  . Sexually Abused: Not on file   No family history on file.   OBJECTIVE:  Vitals:   12/07/19 0914  BP: (!) 153/98  Pulse: (!) 107  Resp: 20  Temp: 98.4 F (36.9 C)  SpO2: 96%     General appearance: alert; no distress CV: radial pulse 2+ Skin: laceration of LT thumb, distal digit, palmar aspect; superficial, apx 0.25 cm in length, no bleeding, discharge, erythema, drainage Psychological: alert and cooperative; normal mood and affect  ASSESSMENT & PLAN:  1. Laceration of left thumb without foreign body without damage to nail, initial encounter      Meds ordered this encounter  Medications  . meloxicam (MOBIC) 15 MG tablet    Sig: Take 1 tablet (15 mg total) by mouth daily.    Dispense:  20 tablet    Refill:  0    Order Specific Question:   Supervising Provider    Answer:   Eustace Moore [[id number]]  . mupirocin ointment (BACTROBAN) 2 %    Sig: Apply 1 application topically 2 (two) times daily.    Dispense:  30 g    Refill:  1    Order Specific Question:   Supervising Provider    Answer:   Eustace Moore [[id number]]    Bandage applied Keep covered for next and dry for next 24-48 hours.  After then you may gently clean with warm water and mild soap.  Avoid submerging wound in water. Change dressing daily and apply a thin layer of bactroban.  Mobic for pain Return or go to the ED if you have any new or worsening symptoms such as increased pain, redness, swelling, drainage, discharge, decreased range of motion of extremity, etc..   Reviewed expectations re: course of current medical issues. Questions answered. Outlined signs and symptoms indicating need for more acute intervention. Patient verbalized understanding. After Visit Summary given.   [name], PA-C 12/07/19 1007

## 2019-12-07 NOTE — Discharge Instructions (Signed)
Bandage applied Keep covered for next and dry for next 24-48 hours.  After then you may gently clean with warm water and mild soap.  Avoid submerging wound in water. Change dressing daily and apply a thin layer of bactroban.  Mobic for pain Return or go to the ED if you have any new or worsening symptoms such as increased pain, redness, swelling, drainage, discharge, decreased range of motion of extremity, etc..

## 2019-12-21 ENCOUNTER — Encounter (HOSPITAL_COMMUNITY): Payer: Self-pay | Admitting: Emergency Medicine

## 2019-12-21 ENCOUNTER — Emergency Department (HOSPITAL_COMMUNITY): Payer: Medicaid Other

## 2019-12-21 ENCOUNTER — Other Ambulatory Visit: Payer: Self-pay

## 2019-12-21 ENCOUNTER — Emergency Department (HOSPITAL_COMMUNITY)
Admission: EM | Admit: 2019-12-21 | Discharge: 2019-12-21 | Discharge status: Against Medical Advice | Payer: Medicaid Other

## 2019-12-21 ENCOUNTER — Ambulatory Visit
Admission: EM | Admit: 2019-12-21 | Discharge: 2019-12-21 | Disposition: A | Discharge status: Home / Self Care | Payer: Medicaid Other

## 2019-12-21 DIAGNOSIS — Z5321 Procedure and treatment not carried out due to patient leaving prior to being seen by health care provider: Secondary | ICD-10-CM | POA: Diagnosis not present

## 2019-12-21 DIAGNOSIS — R1030 Lower abdominal pain, unspecified: Secondary | ICD-10-CM | POA: Insufficient documentation

## 2019-12-21 NOTE — ED Triage Notes (Signed)
Pt states that she fell onto a piece of furniture last night and injured abdomen, pt ambulates bent over and states pain is 10/10  Patient is being discharged from the Urgent Care and sent to the Emergency Department via private vehicle  . Per B Wurst , patient is in need of higher level of care due to severe abdominal pain . Patient is aware and verbalizes understanding of plan of care.  Vitals:   12/21/19 0845  BP: (!) 161/119  Pulse: 93  Resp: (!) 24  Temp: 98.4 F (36.9 C)  SpO2: 97%

## 2019-12-21 NOTE — ED Triage Notes (Signed)
Patient brought in via EMS from home. Alert and oriented. Airway patent. Patient c/o abd pain in lower abd. Patient states started last night after moving a hutch to the floor and falling landing on top of hutch. Denies any nausea, vomiting, diarrhea, fever, or urinary symptoms. Per patient BM this morning-denies any blood but reports pain in abd with BM.

## 2019-12-22 ENCOUNTER — Emergency Department (HOSPITAL_COMMUNITY)
Admission: EM | Admit: 2019-12-22 | Discharge: 2019-12-22 | Disposition: A | Discharge status: Home / Self Care | Payer: Medicaid Other | Attending: Emergency Medicine | Admitting: Emergency Medicine

## 2019-12-30 ENCOUNTER — Other Ambulatory Visit: Payer: Medicaid Other

## 2019-12-31 ENCOUNTER — Other Ambulatory Visit: Payer: Medicaid Other

## 2020-01-01 ENCOUNTER — Other Ambulatory Visit: Payer: Self-pay | Admitting: Critical Care Medicine

## 2020-01-01 ENCOUNTER — Other Ambulatory Visit: Payer: Medicaid Other

## 2020-01-01 DIAGNOSIS — Z20822 Contact with and (suspected) exposure to covid-19: Secondary | ICD-10-CM

## 2020-01-02 LAB — NOVEL CORONAVIRUS, NAA: SARS-CoV-2, NAA: NOT DETECTED

## 2020-01-02 LAB — SARS-COV-2, NAA 2 DAY TAT

## 2020-01-29 ENCOUNTER — Emergency Department (HOSPITAL_COMMUNITY)
Admission: EM | Admit: 2020-01-29 | Discharge: 2020-01-29 | Disposition: A | Discharge status: Home / Self Care | Payer: Medicaid Other | Attending: Emergency Medicine | Admitting: Emergency Medicine

## 2020-01-29 ENCOUNTER — Encounter (HOSPITAL_COMMUNITY): Payer: Self-pay

## 2020-01-29 ENCOUNTER — Other Ambulatory Visit: Payer: Self-pay

## 2020-01-29 DIAGNOSIS — R3981 Functional urinary incontinence: Secondary | ICD-10-CM | POA: Diagnosis not present

## 2020-01-29 DIAGNOSIS — R109 Unspecified abdominal pain: Secondary | ICD-10-CM | POA: Insufficient documentation

## 2020-01-29 DIAGNOSIS — Z5321 Procedure and treatment not carried out due to patient leaving prior to being seen by health care provider: Secondary | ICD-10-CM | POA: Diagnosis not present

## 2020-01-29 NOTE — ED Triage Notes (Signed)
"  I think that I hurt my bladder and my uterus."  Pt states that about a month ago she fell on a hutch/ mirror landing on her abd.  States that since then she has been having urinary incontinent issues.

## 2020-02-04 ENCOUNTER — Ambulatory Visit (INDEPENDENT_AMBULATORY_CARE_PROVIDER_SITE_OTHER): Payer: Medicaid Other

## 2020-02-04 ENCOUNTER — Ambulatory Visit
Admission: RE | Admit: 2020-02-04 | Discharge: 2020-02-04 | Disposition: A | Discharge status: Home / Self Care | Payer: Medicaid Other | Source: Ambulatory Visit | Attending: Emergency Medicine | Admitting: Emergency Medicine

## 2020-02-04 ENCOUNTER — Other Ambulatory Visit: Payer: Self-pay

## 2020-02-04 DIAGNOSIS — M25531 Pain in right wrist: Secondary | ICD-10-CM | POA: Diagnosis not present

## 2020-02-04 DIAGNOSIS — W1781XA Fall down embankment (hill), initial encounter: Secondary | ICD-10-CM

## 2020-02-04 DIAGNOSIS — S62001A Unspecified fracture of navicular [scaphoid] bone of right wrist, initial encounter for closed fracture: Secondary | ICD-10-CM

## 2020-02-04 DIAGNOSIS — S6991XA Unspecified injury of right wrist, hand and finger(s), initial encounter: Secondary | ICD-10-CM

## 2020-02-04 DIAGNOSIS — W19XXXA Unspecified fall, initial encounter: Secondary | ICD-10-CM | POA: Diagnosis not present

## 2020-02-04 MED ORDER — HYDROCODONE-ACETAMINOPHEN 5-325 MG PO TABS: 1.0000 | ORAL_TABLET | Freq: Four times a day (QID) | ORAL | 0 refills | Status: DC | PRN

## 2020-02-04 NOTE — ED Triage Notes (Signed)
Pt presents with right wrist injury after fall yesterday

## 2020-02-04 NOTE — Discharge Instructions (Signed)
Take OTC Tylenol/ibuprofen for moderate pain Hydrocodone/acetaminophen was prescribed for severe pain Follow-up with PCP/orthopedic Follow RICE instruction that is attached Return or go to ED if you develop any new or worsening of symptoms

## 2020-02-04 NOTE — ED Provider Notes (Signed)
[facility]   [id number] 02/04/20 Arrival Time: 1504   Chief Complaint  Patient presents with   Wrist Injury     SUBJECTIVE: History from: patient.  Jill Mosley is a 34 y.o. female presented to the urgent care for complaint of right wrist injury that occurred yesterday.  Reports she fell.  Localized pain to the right wrist.  She describes the pain as constant and achy.  She has tried OTC medications without relief.  Her symptoms are made worse with ROM.  She denies similar symptoms in the past.  Denies chills, fever, nausea, vomiting, diarrhea  ROS: As per HPI.  All other pertinent ROS negative.     Past Medical History:  Diagnosis Date   ADHD    Anxiety    Depression    PTSD (post-traumatic stress disorder)    Past Surgical History:  Procedure Laterality Date   APPENDECTOMY     CHOLECYSTECTOMY     Allergies  Allergen Reactions   Elavil [Amitriptyline] Other (See Comments)    "Can't stay out of the dreams"   Tramadol Rash   No current facility-administered medications on file prior to encounter.   Current Outpatient Medications on File Prior to Encounter  Medication Sig Dispense Refill   cetirizine (ZYRTEC) 10 MG tablet Take 10 mg by mouth daily.     diphenhydrAMINE (BENADRYL) 25 MG tablet Take 25 mg by mouth daily as needed for allergies.     FLUoxetine (PROZAC) 20 MG capsule Take 20 mg by mouth every morning.     gabapentin (NEURONTIN) 100 MG capsule Take 1 capsule (100 mg total) by mouth 3 (three) times daily. 30 capsule 0   meloxicam (MOBIC) 15 MG tablet Take 1 tablet (15 mg total) by mouth daily. 20 tablet 0   methocarbamol (ROBAXIN) 500 MG tablet Take 1 tablet (500 mg total) by mouth 2 (two) times daily. 20 tablet 0   mupirocin ointment (BACTROBAN) 2 % Apply 1 application topically 2 (two) times daily. 30 g 1   Social History   Socioeconomic History   Marital status: Single    Spouse name: Not on file   Number of children: Not  on file   Years of education: Not on file   Highest education level: Not on file  Occupational History   Not on file  Tobacco Use   Smoking status: Current Every Day Smoker    Packs/day: 1.00    Types: Cigarettes   Smokeless tobacco: Never Used  Vaping Use   Vaping Use: Never used  Substance and Sexual Activity   Alcohol use: Not Currently   Drug use: Not Currently   Sexual activity: Not on file  Other Topics Concern   Not on file  Social History Narrative   Not on file   Social Determinants of Health   Financial Resource Strain:    Difficulty of Paying Living Expenses: Not on file  Food Insecurity:    Worried About Running Out of Food in the Last Year: Not on file   Ran Out of Food in the Last Year: Not on file  Transportation Needs:    Lack of Transportation (Medical): Not on file   Lack of Transportation (Non-Medical): Not on file  Physical Activity:    Days of Exercise per Week: Not on file   Minutes of Exercise per Session: Not on file  Stress:    Feeling of Stress : Not on file  Social Connections:    Frequency of Communication with Friends  and Family: Not on file   Frequency of Social Gatherings with Friends and Family: Not on file   Attends Religious Services: Not on file   Active Member of Clubs or Organizations: Not on file   Attends Banker Meetings: Not on file   Marital Status: Not on file  Intimate Partner Violence:    Fear of Current or Ex-Partner: Not on file   Emotionally Abused: Not on file   Physically Abused: Not on file   Sexually Abused: Not on file   Family History  Problem Relation Age of Onset   Diabetes Mother    Hypertension Mother    Hyperlipidemia Mother    Cancer Other    Hypertension Other    Hyperlipidemia Other    Thyroid disease Other     OBJECTIVE:  There were no vitals filed for this visit.   Physical Exam Vitals and nursing note reviewed.  Constitutional:       General: She is not in acute distress.    Appearance: Normal appearance. She is normal weight. She is not ill-appearing, toxic-appearing or diaphoretic.  HENT:     Head: Normocephalic.  Cardiovascular:     Rate and Rhythm: Normal rate and regular rhythm.     Pulses: Normal pulses.     Heart sounds: Normal heart sounds. No murmur heard.  No friction rub. No gallop.   Pulmonary:     Effort: Pulmonary effort is normal. No respiratory distress.     Breath sounds: Normal breath sounds. No stridor. No wheezing, rhonchi or rales.  Chest:     Chest wall: No tenderness.  Musculoskeletal:        General: Tenderness present.     Right wrist: Swelling and tenderness present.     Left wrist: Normal.     Comments: The right wrist is with obvious deformity when compared to the left wrist.  Swelling is present.  There is no ecchymosis, open wound, lesion, surface trauma or warmth present.  Limited range of motion due to pain.  Neurovascular status intact.  Neurological:     Mental Status: She is alert and oriented to person, place, and time.    LABS:  No results found for this or any previous visit (from the past 24 hour(s)).   RADIOLOGY:  DG Wrist Complete Right  Result Date: 02/04/2020 CLINICAL DATA:  Right wrist pain after injury, [place]. EXAM: RIGHT WRIST - COMPLETE 3+ VIEW COMPARISON:  None. FINDINGS: Transverse lucency through the mid scaphoid with slight cortical irregularity about the radial aspect, suspicious for nondisplaced fracture. No other fracture of the wrist. Normal alignment and joint spaces. No significant arthropathy. There may be mild volar soft tissue edema. IMPRESSION: Transverse lucency through the mid scaphoid with mild cortical irregularity, suspicious for nondisplaced fracture. Recommend correlation for snuffbox tenderness and physical exam. No other fracture of the wrist. Electronically Signed   By: Narda Rutherford M.D.   On: 02/04/2020 15:48    Right wrist x-ray is positive for scaphoid fracture.  I have reviewed the x-ray myself and the radiologist interpretation.  I am in agreement with the radiologist interpretation.    ASSESSMENT & PLAN:  1. Right wrist pain   2. Fall, initial encounter   3. Closed nondisplaced fracture of scaphoid of right wrist, unspecified portion of scaphoid, initial encounter     Meds ordered this encounter  Medications   HYDROcodone-acetaminophen (NORCO/VICODIN) 5-325 MG tablet    Sig: Take 1 tablet by  mouth every 6 (six) hours as needed for severe pain.    Dispense:  6 tablet    Refill:  0    Discharge instructions  Take OTC Tylenol/ibuprofen for moderate pain Hydrocodone/acetaminophen was prescribed for severe pain Follow-up with PCP/orthopedic Follow RICE instruction that is attached Return or go to ED if you develop any new or worsening of symptoms  Reviewed expectations re: course of current medical issues. Questions answered. Outlined signs and symptoms indicating need for more acute intervention. Patient verbalized understanding. After Visit Summary given.         Durward Parcel, FNP 02/04/20 1652

## 2020-02-05 ENCOUNTER — Telehealth: Payer: Self-pay | Admitting: Orthopedic Surgery

## 2020-02-05 ENCOUNTER — Ambulatory Visit: Payer: Medicaid Other | Admitting: Urology

## 2020-02-05 NOTE — Telephone Encounter (Signed)
Patient called this morning following visit to [facility] urgent care, [place], for problem of right hand/wrist injury/fracture. Discussed scheduling of appointment, however, upon review of patient's insurance, she is currently under Medicaid [employer], and [facility] providers are all out of network. Provided patient with contact information for Medicaid to try to change plan, and patient voiced understanding of importance of contacting them as well for in-network orthopaedic provider options, due to nature of this medical issue.

## 2020-02-16 ENCOUNTER — Ambulatory Visit
Admission: EM | Admit: 2020-02-16 | Discharge: 2020-02-16 | Disposition: A | Discharge status: Home / Self Care | Payer: Medicaid Other | Attending: Emergency Medicine | Admitting: Emergency Medicine

## 2020-02-16 ENCOUNTER — Other Ambulatory Visit: Payer: Self-pay

## 2020-02-16 DIAGNOSIS — H9209 Otalgia, unspecified ear: Secondary | ICD-10-CM

## 2020-02-16 DIAGNOSIS — H66002 Acute suppurative otitis media without spontaneous rupture of ear drum, left ear: Secondary | ICD-10-CM

## 2020-02-16 MED ORDER — AMOXICILLIN 500 MG PO CAPS: 500.0000 mg | ORAL_CAPSULE | Freq: Two times a day (BID) | ORAL | 0 refills | Status: AC

## 2020-02-16 NOTE — ED Triage Notes (Signed)
Provider triage  

## 2020-02-16 NOTE — ED Provider Notes (Signed)
[facility]   [id number] 02/16/20 Arrival Time: 1034   CC: COVID symptoms  SUBJECTIVE: History from: patient.  Jill Mosley is a 34 y.o. female who presents with fever, tmax of 101.3, and LT ear pain x today.  Denies sick exposure to COVID, flu or strep.  Has NOT tried OTC medication.  Denies aggravating factors.  Reports previous symptoms in the past.   Denies sinus pain, rhinorrhea, sore throat, SOB, wheezing, chest pain, nausea, changes in bowel or bladder habits.    ROS: As per HPI.  All other pertinent ROS negative.     Past Medical History:  Diagnosis Date  . ADHD   . Anxiety   . Depression   . PTSD (post-traumatic stress disorder)    Past Surgical History:  Procedure Laterality Date  . APPENDECTOMY    . CHOLECYSTECTOMY     Allergies  Allergen Reactions  . Elavil [Amitriptyline] Other (See Comments)    "Can't stay out of the dreams"  . Tramadol Rash   No current facility-administered medications on file prior to encounter.   Current Outpatient Medications on File Prior to Encounter  Medication Sig Dispense Refill  . cetirizine (ZYRTEC) 10 MG tablet Take 10 mg by mouth daily.    . diphenhydrAMINE (BENADRYL) 25 MG tablet Take 25 mg by mouth daily as needed for allergies.    Marland Kitchen FLUoxetine (PROZAC) 20 MG capsule Take 20 mg by mouth every morning.    . gabapentin (NEURONTIN) 100 MG capsule Take 1 capsule (100 mg total) by mouth 3 (three) times daily. 30 capsule 0  . HYDROcodone-acetaminophen (NORCO/VICODIN) 5-325 MG tablet Take 1 tablet by mouth every 6 (six) hours as needed for severe pain. 6 tablet 0  . meloxicam (MOBIC) 15 MG tablet Take 1 tablet (15 mg total) by mouth daily. 20 tablet 0  . methocarbamol (ROBAXIN) 500 MG tablet Take 1 tablet (500 mg total) by mouth 2 (two) times daily. 20 tablet 0  . mupirocin ointment (BACTROBAN) 2 % Apply 1 application topically 2 (two) times daily. 30 g 1   Social History   Socioeconomic History  . Marital status:  Single    Spouse name: Not on file  . Number of children: Not on file  . Years of education: Not on file  . Highest education level: Not on file  Occupational History  . Not on file  Tobacco Use  . Smoking status: Current Every Day Smoker    Packs/day: 1.00    Types: Cigarettes  . Smokeless tobacco: Never Used  Vaping Use  . Vaping Use: Never used  Substance and Sexual Activity  . Alcohol use: Not Currently  . Drug use: Not Currently  . Sexual activity: Not on file  Other Topics Concern  . Not on file  Social History Narrative  . Not on file   Social Determinants of Health   Financial Resource Strain:   . Difficulty of Paying Living Expenses: Not on file  Food Insecurity:   . Worried About Programme researcher, broadcasting/film/video in the Last Year: Not on file  . Ran Out of Food in the Last Year: Not on file  Transportation Needs:   . Lack of Transportation (Medical): Not on file  . Lack of Transportation (Non-Medical): Not on file  Physical Activity:   . Days of Exercise per Week: Not on file  . Minutes of Exercise per Session: Not on file  Stress:   . Feeling of Stress : Not on file  Social  Connections:   . Frequency of Communication with Friends and Family: Not on file  . Frequency of Social Gatherings with Friends and Family: Not on file  . Attends Religious Services: Not on file  . Active Member of Clubs or Organizations: Not on file  . Attends Banker Meetings: Not on file  . Marital Status: Not on file  Intimate Partner Violence:   . Fear of Current or Ex-Partner: Not on file  . Emotionally Abused: Not on file  . Physically Abused: Not on file  . Sexually Abused: Not on file   Family History  Problem Relation Age of Onset  . Diabetes Mother   . Hypertension Mother   . Hyperlipidemia Mother   . Cancer Other   . Hypertension Other   . Hyperlipidemia Other   . Thyroid disease Other     OBJECTIVE:  Vitals:   02/16/20 1056  BP: (!) 172/106  Pulse: 76    Resp: 17  Temp: 97.6 F (36.4 C)  TempSrc: Tympanic  SpO2: 95%     General appearance: alert; fatigued, nontoxic; speaking in full sentences and tolerating own secretions HEENT: NCAT; Ears: EACs clear, RT TM pearly gray, LT TM erythematous and tense; Eyes: PERRL.  EOM grossly intact.Nose: nares patent without rhinorrhea, Throat: oropharynx clear, tonsils non erythematous or enlarged, uvula midline  Neck: supple without LAD Lungs: unlabored respirations, symmetrical air entry; cough: absent; no respiratory distress; CTAB Heart: regular rate and rhythm.  Skin: warm and dry Psychological: alert and cooperative; anxious mood and affect   ASSESSMENT & PLAN:  1. Otalgia, unspecified laterality   2. Non-recurrent acute suppurative otitis media of left ear without spontaneous rupture of tympanic membrane     Meds ordered this encounter  Medications  . amoxicillin (AMOXIL) 500 MG capsule    Sig: Take 1 capsule (500 mg total) by mouth 2 (two) times daily for 10 days.    Dispense:  20 capsule    Refill:  0    Order Specific Question:   Supervising Provider    Answer:   Eustace Moore [[id number]]   COVID testing ordered.  It will take between 5-7 days for test results.  Someone will contact you regarding abnormal results.    In the meantime: You should remain isolated in your home for 10 days from symptom onset AND greater than 72 hours after symptoms resolution (absence of fever without the use of fever-reducing medication and improvement in respiratory symptoms), whichever is longer Get plenty of rest and push fluids Amoxicillin prescribed for ear infection.  Take as directed and to completion Use OTC zyrtec for nasal congestion, runny nose, and/or sore throat Use OTC flonase for nasal congestion and runny nose Use medications daily for symptom relief Use OTC medications like ibuprofen or tylenol as needed fever or pain Call or go to the ED if you have any new or worsening symptoms  such as fever, cough, shortness of breath, chest tightness, chest pain, turning blue, changes in mental status, etc...   Reviewed expectations re: course of current medical issues. Questions answered. Outlined signs and symptoms indicating need for more acute intervention. Patient verbalized understanding. After Visit Summary given.         [name], PA-C 02/16/20 1116

## 2020-02-16 NOTE — Discharge Instructions (Signed)
COVID testing ordered.  It will take between 5-7 days for test results.  Someone will contact you regarding abnormal results.    In the meantime: You should remain isolated in your home for 10 days from symptom onset AND greater than 72 hours after symptoms resolution (absence of fever without the use of fever-reducing medication and improvement in respiratory symptoms), whichever is longer Get plenty of rest and push fluids Amoxicillin prescribed for ear infection.  Take as directed and to completion Use OTC zyrtec for nasal congestion, runny nose, and/or sore throat Use OTC flonase for nasal congestion and runny nose Use medications daily for symptom relief Use OTC medications like ibuprofen or tylenol as needed fever or pain Call or go to the ED if you have any new or worsening symptoms such as fever, cough, shortness of breath, chest tightness, chest pain, turning blue, changes in mental status, etc..Marland Kitchen

## 2020-02-18 LAB — COVID-19, FLU A+B AND RSV
Influenza A, NAA: NOT DETECTED
Influenza B, NAA: NOT DETECTED
RSV, NAA: NOT DETECTED
SARS-CoV-2, NAA: NOT DETECTED

## 2020-03-16 ENCOUNTER — Ambulatory Visit: Payer: Medicaid Other | Admitting: Urology

## 2020-03-17 ENCOUNTER — Other Ambulatory Visit: Payer: Medicaid Other

## 2020-05-01 ENCOUNTER — Ambulatory Visit
Admission: EM | Admit: 2020-05-01 | Discharge: 2020-05-01 | Disposition: A | Discharge status: Home / Self Care | Payer: Medicaid Other | Attending: Emergency Medicine | Admitting: Emergency Medicine

## 2020-05-01 ENCOUNTER — Encounter: Payer: Self-pay | Admitting: Emergency Medicine

## 2020-05-01 ENCOUNTER — Ambulatory Visit: Payer: Medicaid Other

## 2020-05-01 ENCOUNTER — Ambulatory Visit (INDEPENDENT_AMBULATORY_CARE_PROVIDER_SITE_OTHER): Payer: Medicaid Other

## 2020-05-01 ENCOUNTER — Other Ambulatory Visit: Payer: Self-pay

## 2020-05-01 DIAGNOSIS — M25531 Pain in right wrist: Secondary | ICD-10-CM | POA: Diagnosis not present

## 2020-05-01 NOTE — Discharge Instructions (Addendum)
°  Continue to take OTC NSAID as needed for pain Follow RICE instruction that is attached Continue to use wrist splint Follow-up with PCP Return or go to ED if you develop any new or worsening of your symptoms

## 2020-05-01 NOTE — ED Triage Notes (Signed)
Pt twisted her wrist the wrong way last night. Now having pain to RT wrist.  Pt had surgery for a fx and has a pin  in this wrist on 11/24

## 2020-05-01 NOTE — ED Provider Notes (Signed)
[facility]   [id number] 05/01/20 Arrival Time: 1213   Chief Complaint  Patient presents with  . Wrist Pain     SUBJECTIVE: History from: patient.  Jill Mosley is a 35 y.o. female presented to the urgent care for complaint of right wrist pain that started last night.  Developed the symptom after twisting her wrist.  She describes the pain as constant and achy.  She has tried OTC medications without relief.  Her symptoms are made worse with ROM.  She denies similar symptoms in the past.  Denies chills, fever, nausea, vomiting, diarrhea  ROS: As per HPI.  All other pertinent ROS negative.      Past Medical History:  Diagnosis Date  . ADHD   . Anxiety   . Depression   . PTSD (post-traumatic stress disorder)    Past Surgical History:  Procedure Laterality Date  . APPENDECTOMY    . CHOLECYSTECTOMY     Allergies  Allergen Reactions  . Elavil [Amitriptyline] Other (See Comments)    "Can't stay out of the dreams"  . Tramadol Rash   No current facility-administered medications on file prior to encounter.   Current Outpatient Medications on File Prior to Encounter  Medication Sig Dispense Refill  . cetirizine (ZYRTEC) 10 MG tablet Take 10 mg by mouth daily.    . diphenhydrAMINE (BENADRYL) 25 MG tablet Take 25 mg by mouth daily as needed for allergies.    Marland Kitchen FLUoxetine (PROZAC) 20 MG capsule Take 20 mg by mouth every morning.    . gabapentin (NEURONTIN) 100 MG capsule Take 1 capsule (100 mg total) by mouth 3 (three) times daily. 30 capsule 0  . HYDROcodone-acetaminophen (NORCO/VICODIN) 5-325 MG tablet Take 1 tablet by mouth every 6 (six) hours as needed for severe pain. 6 tablet 0  . meloxicam (MOBIC) 15 MG tablet Take 1 tablet (15 mg total) by mouth daily. 20 tablet 0  . methocarbamol (ROBAXIN) 500 MG tablet Take 1 tablet (500 mg total) by mouth 2 (two) times daily. 20 tablet 0  . mupirocin ointment (BACTROBAN) 2 % Apply 1 application topically 2 (two) times daily.  30 g 1   Social History   Socioeconomic History  . Marital status: Single    Spouse name: Not on file  . Number of children: Not on file  . Years of education: Not on file  . Highest education level: Not on file  Occupational History  . Not on file  Tobacco Use  . Smoking status: Current Every Day Smoker    Packs/day: 1.00    Types: Cigarettes  . Smokeless tobacco: Never Used  Vaping Use  . Vaping Use: Never used  Substance and Sexual Activity  . Alcohol use: Not Currently  . Drug use: Not Currently  . Sexual activity: Not on file  Other Topics Concern  . Not on file  Social History Narrative  . Not on file   Social Determinants of Health   Financial Resource Strain: Not on file  Food Insecurity: Not on file  Transportation Needs: Not on file  Physical Activity: Not on file  Stress: Not on file  Social Connections: Not on file  Intimate Partner Violence: Not on file   Family History  Problem Relation Age of Onset  . Diabetes Mother   . Hypertension Mother   . Hyperlipidemia Mother   . Cancer Other   . Hypertension Other   . Hyperlipidemia Other   . Thyroid disease Other     OBJECTIVE:  Vitals:   05/01/20 1221 05/01/20 1222  BP:  (!) 147/87  Pulse:  93  Resp:  19  Temp:  98.3 F (36.8 C)  TempSrc:  Oral  SpO2:  95%  Height: 5\' 9"  (1.753 m)      Physical Exam Vitals and nursing note reviewed.  Constitutional:      General: She is not in acute distress.    Appearance: Normal appearance. She is normal weight. She is not ill-appearing, toxic-appearing or diaphoretic.  HENT:     Head: Normocephalic.  Cardiovascular:     Rate and Rhythm: Normal rate and regular rhythm.     Pulses: Normal pulses.     Heart sounds: Normal heart sounds. No murmur heard. No friction rub. No gallop.   Pulmonary:     Effort: Pulmonary effort is normal. No respiratory distress.     Breath sounds: Normal breath sounds. No stridor. No wheezing, rhonchi or rales.  Chest:      Chest wall: No tenderness.  Musculoskeletal:        General: Tenderness present.     Right wrist: Tenderness present.     Left wrist: Normal.     Comments: The right wrist is without any obvious deformity or asymmetry when compared to the left wrist.  There is no ecchymosis, open wound, lesion, warmth present.  Limited range of motion due to pain.  Neurovascular status intact.  Neurological:     Mental Status: She is alert and oriented to person, place, and time.      LABS:  No results found for this or any previous visit (from the past 24 hour(s)).   RADIOLOGY:  DG Wrist Complete Right  Result Date: 05/01/2020 CLINICAL DATA:  Wrist injury and pain. EXAM: RIGHT WRIST - COMPLETE 3+ VIEW COMPARISON:  02/04/2020 FINDINGS: Patient is status post ORIF for scaphoid waist fracture. No evidence for osteonecrosis in the scaphoid bone. No acute fracture evident. No subluxation or dislocation. IMPRESSION: Postsurgical changes in the scaphoid. No evidence for acute bony abnormality. Electronically Signed   By: 13/05/2019 M.D.   On: 05/01/2020 12:51   Right wrist X-ray is negative for bony abnormality including fracture or dislocation.  I have reviewed the x-ray myself and the radiologist interpretation.  I am in agreement with the radiologist interpretation.  ASSESSMENT & PLAN:  1. Right wrist pain     No orders of the defined types were placed in this encounter.   Discharge Instructions  Continue to take OTC NSAID as needed for pain Follow RICE instruction that is attached Continue to use wrist splint Follow-up with PCP Return or go to ED if you develop any new or worsening of your symptoms   Reviewed expectations re: course of current medical issues. Questions answered. Outlined signs and symptoms indicating need for more acute intervention. Patient verbalized understanding. After Visit Summary given.         05/03/2020, FNP 05/01/20 1330

## 2020-06-08 ENCOUNTER — Ambulatory Visit
Admission: EM | Admit: 2020-06-08 | Discharge: 2020-06-08 | Disposition: A | Discharge status: Home / Self Care | Payer: Medicaid Other | Attending: Emergency Medicine | Admitting: Emergency Medicine

## 2020-06-08 ENCOUNTER — Encounter: Payer: Self-pay | Admitting: Emergency Medicine

## 2020-06-08 DIAGNOSIS — N390 Urinary tract infection, site not specified: Secondary | ICD-10-CM | POA: Diagnosis present

## 2020-06-08 DIAGNOSIS — M545 Low back pain, unspecified: Secondary | ICD-10-CM | POA: Diagnosis present

## 2020-06-08 LAB — POCT URINALYSIS DIP (MANUAL ENTRY)
Glucose, UA: NEGATIVE mg/dL
Ketones, POC UA: NEGATIVE mg/dL
Leukocytes, UA: NEGATIVE
Nitrite, UA: POSITIVE — AB
Protein Ur, POC: 100 mg/dL — AB
Spec Grav, UA: 1.025 (ref 1.010–1.025)
Urobilinogen, UA: 1 E.U./dL
pH, UA: 8.5 — AB (ref 5.0–8.0)

## 2020-06-08 MED ORDER — PREDNISONE 10 MG PO TABS: 20.0000 mg | ORAL_TABLET | Freq: Every day | ORAL | 0 refills | Status: DC

## 2020-06-08 MED ORDER — DEXAMETHASONE SODIUM PHOSPHATE 10 MG/ML IJ SOLN: 10.0000 mg | Freq: Once | INTRAMUSCULAR | Status: DC

## 2020-06-08 MED ORDER — TIZANIDINE HCL 4 MG PO CAPS: 4.0000 mg | ORAL_CAPSULE | Freq: Three times a day (TID) | ORAL | 0 refills | Status: DC

## 2020-06-08 MED ORDER — NITROFURANTOIN MONOHYD MACRO 100 MG PO CAPS: 100.0000 mg | ORAL_CAPSULE | Freq: Two times a day (BID) | ORAL | 0 refills | Status: DC

## 2020-06-08 NOTE — ED Provider Notes (Signed)
[facility]   [id number] 06/08/20 Arrival Time: 1003   Chief Complaint  Patient presents with  . Back Pain    SUBJECTIVE: History from: patient.  Jill Mosley is a 35 y.o. female presented to the urgent care for complaint of back pain for the past 3 days.  Denies any precipitating event, trauma or injury.  She localized pain to the midline  low back.  She describes the pain as constant and achy.  She has tried OTC medications without relief.  Her symptoms are made worse with ROM.  She denies similar symptoms in the past.  Denies chills, fever, nausea, vomiting, diarrhea.   ROS: As per HPI.  All other pertinent ROS negative.     Past Medical History:  Diagnosis Date  . ADHD   . Anxiety   . Depression   . PTSD (post-traumatic stress disorder)    Past Surgical History:  Procedure Laterality Date  . APPENDECTOMY    . CHOLECYSTECTOMY     Allergies  Allergen Reactions  . Elavil [Amitriptyline] Other (See Comments)    "Can't stay out of the dreams"  . Tramadol Rash   No current facility-administered medications on file prior to encounter.   Current Outpatient Medications on File Prior to Encounter  Medication Sig Dispense Refill  . cetirizine (ZYRTEC) 10 MG tablet Take 10 mg by mouth daily.    . diphenhydrAMINE (BENADRYL) 25 MG tablet Take 25 mg by mouth daily as needed for allergies.    Marland Kitchen FLUoxetine (PROZAC) 20 MG capsule Take 20 mg by mouth every morning.    . gabapentin (NEURONTIN) 100 MG capsule Take 1 capsule (100 mg total) by mouth 3 (three) times daily. 30 capsule 0  . HYDROcodone-acetaminophen (NORCO/VICODIN) 5-325 MG tablet Take 1 tablet by mouth every 6 (six) hours as needed for severe pain. 6 tablet 0  . meloxicam (MOBIC) 15 MG tablet Take 1 tablet (15 mg total) by mouth daily. 20 tablet 0  . methocarbamol (ROBAXIN) 500 MG tablet Take 1 tablet (500 mg total) by mouth 2 (two) times daily. 20 tablet 0  . mupirocin ointment (BACTROBAN) 2 % Apply 1  application topically 2 (two) times daily. 30 g 1   Social History   Socioeconomic History  . Marital status: Single    Spouse name: Not on file  . Number of children: Not on file  . Years of education: Not on file  . Highest education level: Not on file  Occupational History  . Not on file  Tobacco Use  . Smoking status: Current Every Day Smoker    Packs/day: 1.00    Types: Cigarettes  . Smokeless tobacco: Never Used  Vaping Use  . Vaping Use: Never used  Substance and Sexual Activity  . Alcohol use: Not Currently  . Drug use: Not Currently  . Sexual activity: Not on file  Other Topics Concern  . Not on file  Social History Narrative  . Not on file   Social Determinants of Health   Financial Resource Strain: Not on file  Food Insecurity: Not on file  Transportation Needs: Not on file  Physical Activity: Not on file  Stress: Not on file  Social Connections: Not on file  Intimate Partner Violence: Not on file   Family History  Problem Relation Age of Onset  . Diabetes Mother   . Hypertension Mother   . Hyperlipidemia Mother   . Cancer Other   . Hypertension Other   . Hyperlipidemia Other   .  Thyroid disease Other     OBJECTIVE:  Vitals:   06/08/20 1042  BP: (!) 163/111  Pulse: 81  Resp: 18  Temp: 98.7 F (37.1 C)  TempSrc: Oral  SpO2: 97%     Physical Exam Vitals and nursing note reviewed.  Constitutional:      General: She is not in acute distress.    Appearance: Normal appearance. She is normal weight. She is not ill-appearing, toxic-appearing or diaphoretic.  HENT:     Head: Normocephalic.  Cardiovascular:     Rate and Rhythm: Normal rate and regular rhythm.     Pulses: Normal pulses.     Heart sounds: Normal heart sounds. No murmur heard. No friction rub. No gallop.   Pulmonary:     Effort: Pulmonary effort is normal. No respiratory distress.     Breath sounds: Normal breath sounds. No stridor. No wheezing, rhonchi or rales.  Chest:      Chest wall: No tenderness.  Musculoskeletal:        General: Tenderness present.     Lumbar back: Spasms and tenderness present.     Comments: Back:  Patient ambulates from chair to exam table without difficulty.  Inspection: Skin clear and intact without obvious swelling, erythema, or ecchymosis. Warm to the touch  Palpation: Vertebral processes nontender. Tenderness about the lower paravertebral muscles     Neurological:     Mental Status: She is alert and oriented to person, place, and time.      LABS:  Results for orders placed or performed during the hospital encounter of 06/08/20 (from the past 24 hour(s))  POCT urinalysis dipstick     Status: Abnormal   Collection Time: 06/08/20 11:39 AM  Result Value Ref Range   Color, UA red (A) yellow   Clarity, UA cloudy (A) clear   Glucose, UA negative negative mg/dL   Bilirubin, UA small (A) negative   Ketones, POC UA negative negative mg/dL   Spec Grav, UA 4.098 1.191 - 1.025   Blood, UA large (A) negative   pH, UA 8.5 (A) 5.0 - 8.0   Protein Ur, POC =100 (A) negative mg/dL   Urobilinogen, UA 1.0 0.2 or 1.0 E.U./dL   Nitrite, UA Positive (A) Negative   Leukocytes, UA Negative Negative     ASSESSMENT & PLAN:  1. Acute midline low back pain without sciatica   2. Acute UTI     Meds ordered this encounter  Medications  . tiZANidine (ZANAFLEX) 4 MG capsule    Sig: Take 1 capsule (4 mg total) by mouth 3 (three) times daily.    Dispense:  30 capsule    Refill:  0  . predniSONE (DELTASONE) 10 MG tablet    Sig: Take 2 tablets (20 mg total) by mouth daily.    Dispense:  15 tablet    Refill:  0  . DISCONTD: dexamethasone (DECADRON) injection 10 mg  . nitrofurantoin, macrocrystal-monohydrate, (MACROBID) 100 MG capsule    Sig: Take 1 capsule (100 mg total) by mouth 2 (two) times daily.    Dispense:  10 capsule    Refill:  0    Discharge instructions  Urinalysis positive for UTI Urine culture was ordered.  Someone will  call if your result is abnormal. Will prescribe Macrobid Rest, ice and heat as needed Ensure adequate ROM as tolerated. Prescribed  prednisone for inflammation Prescribed flexeril  for muscle spasm.  Do not drive or operate heavy machinery while taking this medication Return here or go  to ER if you have any new or worsening symptoms such as numbness/tingling of the inner thighs, loss of bladder or bowel control, headache/blurry vision, nausea/vomiting, confusion/altered mental status, dizziness, weakness, passing out, imbalance, etc...    Reviewed expectations re: course of current medical issues. Questions answered. Outlined signs and symptoms indicating need for more acute intervention. Patient verbalized understanding. After Visit Summary given.         Durward Parcel, FNP 06/08/20 1148

## 2020-06-08 NOTE — ED Triage Notes (Signed)
Back pain since Friday.  Denies pain on urination.  No known injury

## 2020-06-08 NOTE — Discharge Instructions (Addendum)
Urinalysis positive for UTI Urine culture was ordered.  Someone will call if your result is abnormal. Will prescribe Macrobid Rest, ice and heat as needed Ensure adequate ROM as tolerated. Prescribed  prednisone for inflammation Prescribed flexeril  for muscle spasm.  Do not drive or operate heavy machinery while taking this medication Return here or go to ER if you have any new or worsening symptoms such as numbness/tingling of the inner thighs, loss of bladder or bowel control, headache/blurry vision, nausea/vomiting, confusion/altered mental status, dizziness, weakness, passing out, imbalance, etc..Marland Kitchen

## 2020-06-10 LAB — URINE CULTURE

## 2020-09-06 ENCOUNTER — Encounter: Payer: Self-pay | Admitting: Emergency Medicine

## 2020-09-06 ENCOUNTER — Ambulatory Visit
Admission: EM | Admit: 2020-09-06 | Discharge: 2020-09-06 | Disposition: A | Discharge status: Home / Self Care | Payer: Medicaid Other | Attending: Family Medicine | Admitting: Family Medicine

## 2020-09-06 ENCOUNTER — Other Ambulatory Visit: Payer: Self-pay

## 2020-09-06 DIAGNOSIS — J209 Acute bronchitis, unspecified: Secondary | ICD-10-CM

## 2020-09-06 DIAGNOSIS — R059 Cough, unspecified: Secondary | ICD-10-CM | POA: Diagnosis not present

## 2020-09-06 MED ORDER — PREDNISONE 10 MG PO TABS: 20.0000 mg | ORAL_TABLET | Freq: Every day | ORAL | 0 refills | Status: AC

## 2020-09-06 MED ORDER — ALBUTEROL SULFATE HFA 108 (90 BASE) MCG/ACT IN AERS: 2.0000 | INHALATION_SPRAY | Freq: Four times a day (QID) | RESPIRATORY_TRACT | 2 refills | Status: DC | PRN

## 2020-09-06 MED ORDER — DOXYCYCLINE HYCLATE 100 MG PO CAPS: 100.0000 mg | ORAL_CAPSULE | Freq: Two times a day (BID) | ORAL | 0 refills | Status: DC

## 2020-09-06 MED ORDER — PREDNISONE 10 MG PO TABS: 20.0000 mg | ORAL_TABLET | Freq: Every day | ORAL | 0 refills | Status: DC

## 2020-09-06 MED ORDER — PROMETHAZINE-DM 6.25-15 MG/5ML PO SYRP: 1.2500 mL | ORAL_SOLUTION | Freq: Three times a day (TID) | ORAL | 0 refills | Status: DC | PRN

## 2020-09-06 MED ORDER — ALBUTEROL SULFATE HFA 108 (90 BASE) MCG/ACT IN AERS: 2.0000 | INHALATION_SPRAY | Freq: Four times a day (QID) | RESPIRATORY_TRACT | Status: DC | PRN

## 2020-09-06 NOTE — ED Provider Notes (Signed)
[facility]    CSN: [id number] Arrival date & time: 09/06/20  0955      History   Chief Complaint Chief Complaint  Patient presents with  . Nasal Congestion    HPI Jill Mosley is a 35 y.o. female.   HPI Patient presents with URI symptoms including cough, sore throat, otalgia, nasal congestion. Unknown of COVID exposure.  She reports spiking a fever last night of 101.  She is afebrile at present.  She has been taking multiple over-the-counter cough and cold and sinus medications without relief of symptoms.  Symptoms have been present for approximately 2 weeks.  Patient is a smoker and reports that she often gets bronchitis and sinusitis. Past Medical History:  Diagnosis Date  . ADHD   . Anxiety   . Depression   . PTSD (post-traumatic stress disorder)     There are no problems to display for this patient.   Past Surgical History:  Procedure Laterality Date  . APPENDECTOMY    . CHOLECYSTECTOMY      OB History    Gravida  3   Para  3   Term  3   Preterm      AB      Living  3     SAB      IAB      Ectopic      Multiple      Live Births               Home Medications    Prior to Admission medications   Medication Sig Start Date End Date Taking? Authorizing Provider  cetirizine (ZYRTEC) 10 MG tablet Take 10 mg by mouth daily.    [provider]  diphenhydrAMINE (BENADRYL) 25 MG tablet Take 25 mg by mouth daily as needed for allergies.    [provider]  FLUoxetine (PROZAC) 20 MG capsule Take 20 mg by mouth every morning. 08/12/19   [provider]  gabapentin (NEURONTIN) 100 MG capsule Take 1 capsule (100 mg total) by mouth 3 (three) times daily. 09/16/19   Avegno, Zachery Dakins, FNP  HYDROcodone-acetaminophen (NORCO/VICODIN) 5-325 MG tablet Take 1 tablet by mouth every 6 (six) hours as needed for severe pain. 02/04/20   Avegno, Zachery Dakins, FNP  meloxicam (MOBIC) 15 MG tablet Take 1 tablet (15 mg total) by mouth  daily. 12/07/19   Wurst, [place], PA-C  methocarbamol (ROBAXIN) 500 MG tablet Take 1 tablet (500 mg total) by mouth 2 (two) times daily. 10/07/19   Jeannie Fend, PA-C  mupirocin ointment (BACTROBAN) 2 % Apply 1 application topically 2 (two) times daily. 12/07/19   Wurst, [place], PA-C  nitrofurantoin, macrocrystal-monohydrate, (MACROBID) 100 MG capsule Take 1 capsule (100 mg total) by mouth 2 (two) times daily. 06/08/20   Avegno, Zachery Dakins, FNP  predniSONE (DELTASONE) 10 MG tablet Take 2 tablets (20 mg total) by mouth daily. 06/08/20   Avegno, Zachery Dakins, FNP  tiZANidine (ZANAFLEX) 4 MG capsule Take 1 capsule (4 mg total) by mouth 3 (three) times daily. 06/08/20   Durward Parcel, FNP    Family History Family History  Problem Relation Age of Onset  . Diabetes Mother   . Hypertension Mother   . Hyperlipidemia Mother   . Cancer Other   . Hypertension Other   . Hyperlipidemia Other   . Thyroid disease Other     Social History Social History   Tobacco Use  . Smoking status: Current Every Day Smoker  Packs/day: 1.00    Types: Cigarettes  . Smokeless tobacco: Never Used  Vaping Use  . Vaping Use: Never used  Substance Use Topics  . Alcohol use: Not Currently  . Drug use: Not Currently     Allergies   Elavil [amitriptyline] and Tramadol   Review of Systems Review of Systems Pertinent negatives listed in HPI   Physical Exam Triage Vital Signs ED Triage Vitals  Enc Vitals Group     BP 09/06/20 1033 (!) 144/87     Pulse Rate 09/06/20 1033 79     Resp 09/06/20 1033 19     Temp 09/06/20 1033 97.8 F (36.6 C)     Temp Source 09/06/20 1033 Oral     SpO2 09/06/20 1033 95 %     Weight --      Height --      Head Circumference --      Peak Flow --      Pain Score 09/06/20 1031 8     Pain Loc --      Pain Edu? --      Excl. in GC? --    No data found.  Updated Vital Signs BP (!) 144/87 (BP Location: Right Arm)   Pulse 79   Temp 97.8 F (36.6 C) (Oral)   Resp 19    SpO2 95%   Visual Acuity Right Eye Distance:   Left Eye Distance:   Bilateral Distance:    Right Eye Near:   Left Eye Near:    Bilateral Near:     Physical Exam General appearance:Alert, Ill-appearing, no distress Head: Normocephalic, without obvious abnormality, atraumatic ENT: Ears mucosal edema, nasal congestion, erythematous oropharynx w/o exudate Respiratory: Respirations even , unlabored, coarse lung sound, expiratory wheeze Heart: rate and rhythm normal. No gallop or murmurs noted on exam  Abdomen: BS +, no distention, no rebound tenderness, or no mass Extremities: No gross deformities Skin: Skin color, texture, turgor normal. No rashes seen  Psych: Appropriate mood and affect. Neurologic: GCS 15, normal coordination normal gait UC Treatments / Results  Labs (all labs ordered are listed, but only abnormal results are displayed) Labs Reviewed  COVID-19, FLU A+B NAA    EKG   Radiology No results found.  Procedures Procedures (including critical care time)  Medications Ordered in UC Medications - No data to display  Initial Impression / Assessment and Plan / UC Course  I have reviewed the triage vital signs and the nursing notes.  Pertinent labs & imaging results that were available during my care of the patient were reviewed by me and considered in my medical decision making (see chart for details).      Treating for acute bronchitis treatment per discharge instructions.  COVID/flu pending. Continue to monitor fever alternate Tylenol and ibuprofen as needed for fever.  Albuterol inhaler given here in clinic.  Patient was advised to use 2 puffs every 4-6 hours as needed for wheezing.  ER if symptoms become severe. Return precautions given. Final Clinical Impressions(s) / UC Diagnoses   Final diagnoses:  Cough  Acute bronchitis, unspecified organism   Discharge Instructions   None    ED Prescriptions    Medication Sig Dispense Auth. Provider    albuterol (VENTOLIN HFA) 108 (90 Base) MCG/ACT inhaler Inhale 2 puffs into the lungs every 6 (six) hours as needed for wheezing or shortness of breath. 8 g Bing Neighbors, FNP   doxycycline (VIBRAMYCIN) 100 MG capsule Take 1 capsule (100 mg total) by  mouth 2 (two) times daily. 20 capsule Bing Neighbors, FNP   promethazine-dextromethorphan (PROMETHAZINE-DM) 6.25-15 MG/5ML syrup Take 1.3 mLs by mouth 3 (three) times daily as needed for cough. 118 mL Bing Neighbors, FNP   predniSONE (DELTASONE) 10 MG tablet Take 2 tablets (20 mg total) by mouth daily for 5 days. 10 tablet Bing Neighbors, FNP     PDMP not reviewed this encounter.   Bing Neighbors, FNP 09/06/20 1122

## 2020-09-06 NOTE — ED Triage Notes (Addendum)
Sneezing, runny nose and coughing up foul tasting mucous x 2weeks.  Started running fever last night.  Has been taking flonase, zyrtec, benadryl and mucinex with no relief.

## 2020-09-08 LAB — COVID-19, FLU A+B NAA
Influenza A, NAA: NOT DETECTED
Influenza B, NAA: NOT DETECTED
SARS-CoV-2, NAA: NOT DETECTED

## 2020-09-16 ENCOUNTER — Encounter: Payer: Self-pay | Admitting: Adult Health

## 2020-09-16 ENCOUNTER — Other Ambulatory Visit: Payer: Self-pay

## 2020-09-16 ENCOUNTER — Other Ambulatory Visit (HOSPITAL_COMMUNITY)
Admission: RE | Admit: 2020-09-16 | Discharge: 2020-09-16 | Disposition: A | Discharge status: Home / Self Care | Payer: Medicaid Other | Source: Ambulatory Visit | Attending: Adult Health | Admitting: Adult Health

## 2020-09-16 ENCOUNTER — Ambulatory Visit (INDEPENDENT_AMBULATORY_CARE_PROVIDER_SITE_OTHER): Payer: Medicaid Other | Admitting: Adult Health

## 2020-09-16 VITALS — BP 146/83 | HR 89 | Ht 68.0 in | Wt 243.5 lb

## 2020-09-16 DIAGNOSIS — Z0000 Encounter for general adult medical examination without abnormal findings: Secondary | ICD-10-CM | POA: Diagnosis present

## 2020-09-16 DIAGNOSIS — Z3009 Encounter for other general counseling and advice on contraception: Secondary | ICD-10-CM | POA: Diagnosis not present

## 2020-09-16 DIAGNOSIS — N920 Excessive and frequent menstruation with regular cycle: Secondary | ICD-10-CM | POA: Diagnosis not present

## 2020-09-16 DIAGNOSIS — Z01419 Encounter for gynecological examination (general) (routine) without abnormal findings: Secondary | ICD-10-CM | POA: Insufficient documentation

## 2020-09-16 NOTE — Progress Notes (Signed)
  Subjective:     Patient ID: Jill Mosley, female   DOB: [birth year], 35 y.o.   MRN: [id number]  HPI Jill Mosley is a 35 year old white female, single, G3P3 in for pap and pelvic.Had physcial with PCP 07/15/20.  PCP is Jill Mosley  Review of Systems Patient denies any headaches, hearing loss, fatigue, blurred vision, shortness of breath, chest pain, abdominal pain, problems with bowel movements, urination, or intercourse. No joint pain or Mosley swings. Periods last 5-7 days and 3 days are heavy, changes pad every 4 hours, and back aches.  Reviewed past medical,surgical, social and family history. Reviewed medications and allergies.     Objective:   Physical Exam BP (!) 146/83 (BP Location: Left Arm, Patient Position: Sitting, Cuff Size: Large)   Pulse 89   Ht 5\' 8"  (1.727 m)   Wt 243 lb 8 oz (110.5 kg)   LMP 08/27/2020   BMI 37.02 kg/m     Skin warm and dry.Pelvic: external genitalia is normal in appearance no lesions, vagina: pink,urethra has no lesions or masses noted, cervix:smooth and bulbous, pap with GC/CHL and HR HPV genotyping performed, uterus: normal size, shape and contour, non tender, no masses felt, adnexa: no masses or tenderness noted. Bladder is non tender and no masses felt.  AA is 0  Fall risk is high Depression screen PHQ 2/9 09/16/2020  Decreased Interest 1  Down, Depressed, Hopeless 2  PHQ - 2 Score 3  Altered sleeping 2  Tired, decreased energy 3  Change in appetite 1  Feeling bad or failure about yourself  0  Trouble concentrating 3  Moving slowly or fidgety/restless 0  Suicidal thoughts 0  PHQ-9 Score 12    GAD 7 : Generalized Anxiety Score 09/16/2020  Nervous, Anxious, on Edge 3  Control/stop worrying 3  Worry too much - different things 3  Trouble relaxing 2  Restless 3  Easily annoyed or irritable 1  Afraid - awful might happen 2  Total GAD 7 Score 17    She is on prozac.   Upstream - 09/16/20 1546       Pregnancy Intention Screening   Does the  patient want to become pregnant in the next year? No    Does the patient's partner want to become pregnant in the next year? No    Would the patient like to discuss contraceptive options today? Yes      Contraception Wrap Up   Current Method Abstinence    End Method Female Condom            Examination chaperoned by 09/18/20 LPN.  Assessment:     1. Routine general medical examination at a health care facility Pap sent   2. Encounter for gynecological examination with Papanicolaou smear of cervix Pap sent Pap in 3 years if normal Physical with PCP  3. General counseling and advice for contraceptive management She wants tubal ligation and ablation, tubal papers signed Review handout on tubal by [facility]   4. Menorrhagia with regular cycle She wants ablation Plan:     Return in 4 weeks for pre op with Jill Jill Mosley Pap in 3 years if normal

## 2020-09-17 ENCOUNTER — Ambulatory Visit: Payer: Medicaid Other | Attending: Critical Care Medicine

## 2020-09-17 DIAGNOSIS — Z20822 Contact with and (suspected) exposure to covid-19: Secondary | ICD-10-CM

## 2020-09-18 LAB — NOVEL CORONAVIRUS, NAA: SARS-CoV-2, NAA: NOT DETECTED

## 2020-09-18 LAB — SARS-COV-2, NAA 2 DAY TAT

## 2020-09-24 ENCOUNTER — Encounter: Payer: Self-pay | Admitting: Adult Health

## 2020-09-24 DIAGNOSIS — R87810 Cervical high risk human papillomavirus (HPV) DNA test positive: Secondary | ICD-10-CM

## 2020-09-24 HISTORY — DX: Cervical high risk human papillomavirus (HPV) DNA test positive: R87.810

## 2020-09-24 LAB — CYTOLOGY - PAP
Chlamydia: NEGATIVE
Comment: NEGATIVE
Comment: NEGATIVE
Comment: NEGATIVE
Comment: NORMAL
Diagnosis: NEGATIVE
Diagnosis: REACTIVE
HPV 16: NEGATIVE
HPV 18 / 45: NEGATIVE
High risk HPV: POSITIVE — AB
Neisseria Gonorrhea: NEGATIVE

## 2020-10-13 ENCOUNTER — Ambulatory Visit: Payer: Medicaid Other | Admitting: Obstetrics & Gynecology

## 2020-11-03 ENCOUNTER — Other Ambulatory Visit: Payer: Self-pay

## 2020-11-03 ENCOUNTER — Encounter: Payer: Self-pay | Admitting: *Deleted

## 2020-11-03 ENCOUNTER — Ambulatory Visit
Admission: EM | Admit: 2020-11-03 | Discharge: 2020-11-03 | Disposition: A | Discharge status: Home / Self Care | Payer: Medicaid Other | Attending: Emergency Medicine | Admitting: Emergency Medicine

## 2020-11-03 DIAGNOSIS — M5441 Lumbago with sciatica, right side: Secondary | ICD-10-CM | POA: Diagnosis not present

## 2020-11-03 LAB — POCT URINALYSIS DIP (MANUAL ENTRY)
Bilirubin, UA: NEGATIVE
Glucose, UA: NEGATIVE mg/dL
Ketones, POC UA: NEGATIVE mg/dL
Leukocytes, UA: NEGATIVE
Nitrite, UA: NEGATIVE
Protein Ur, POC: NEGATIVE mg/dL
Spec Grav, UA: 1.015 (ref 1.010–1.025)
Urobilinogen, UA: 0.2 E.U./dL
pH, UA: 5.5 (ref 5.0–8.0)

## 2020-11-03 LAB — POCT URINE PREGNANCY: Preg Test, Ur: NEGATIVE

## 2020-11-03 MED ORDER — KETOROLAC TROMETHAMINE 30 MG/ML IJ SOLN
30.0000 mg | Freq: Once | INTRAMUSCULAR | Status: AC
  Administered 2020-11-03: 30 mg via INTRAMUSCULAR

## 2020-11-03 MED ORDER — PREDNISONE 20 MG PO TABS: 40.0000 mg | ORAL_TABLET | Freq: Every day | ORAL | 0 refills | Status: DC

## 2020-11-03 MED ORDER — TIZANIDINE HCL 4 MG PO TABS: 4.0000 mg | ORAL_TABLET | Freq: Four times a day (QID) | ORAL | 0 refills | Status: DC | PRN

## 2020-11-03 NOTE — ED Triage Notes (Signed)
Pt reports back pain since Friday that radiates into buttocks on Rt side.

## 2020-11-03 NOTE — ED Provider Notes (Signed)
[facility]    CSN: [id number] Arrival date & time: 11/03/20  1135      History   Chief Complaint Chief Complaint  Patient presents with   Back Pain   UTI    HPI Jill Mosley is a 35 y.o. female.   HPI Patient presents today for evaluation of low back pain.  Patient has been seen here previously for acute back pain with sciatica.  Patient reports some heavy lifting over the weekend and pain subsequently developed the following day.  She is also concerned that she may have a urinary tract infection given that her back pain has not readily resolved.  She is not having any overt UTI-like symptoms.  She is afebrile.  Denies any nausea or vomiting.  She has taken Tylenol and ibuprofen without relief of back pain.  She is fully ambulatory. Past Medical History:  Diagnosis Date   ADHD    Anxiety    Depression    Papanicolaou smear of cervix with positive high risk human papilloma virus (HPV) test 09/24/2020   09/24/20 repeat in 1 year per ASCCP guidelines, 5 year risk of CIN 3+ is 4.8%   PTSD (post-traumatic stress disorder)     Patient Active Problem List   Diagnosis Date Noted   Papanicolaou smear of cervix with positive high risk human papilloma virus (HPV) test 09/24/2020   Menorrhagia with regular cycle 09/16/2020   General counseling and advice for contraceptive management 09/16/2020   Encounter for gynecological examination with Papanicolaou smear of cervix 09/16/2020   Routine general medical examination at a health care facility 09/16/2020    Past Surgical History:  Procedure Laterality Date   APPENDECTOMY     CHOLECYSTECTOMY     WRIST FRACTURE SURGERY Right 2021    OB History     Gravida  3   Para  3   Term  3   Preterm      AB      Living  3      SAB      IAB      Ectopic      Multiple      Live Births  3            Home Medications    Prior to Admission medications   Medication Sig Start Date End Date Taking? Authorizing  Provider  albuterol (VENTOLIN HFA) 108 (90 Base) MCG/ACT inhaler Inhale 2 puffs into the lungs every 6 (six) hours as needed for wheezing or shortness of breath. 09/06/20  Yes Bing Neighbors, FNP  cetirizine (ZYRTEC) 10 MG tablet Take 10 mg by mouth daily.   Yes [provider]  diphenhydrAMINE (BENADRYL) 25 MG tablet Take 25 mg by mouth daily as needed for allergies.   Yes [provider]  FLUoxetine (PROZAC) 20 MG capsule Take 20 mg by mouth every morning. 08/12/19  Yes [provider]  predniSONE (DELTASONE) 20 MG tablet Take 2 tablets (40 mg total) by mouth daily with breakfast. 11/03/20  Yes Bing Neighbors, FNP  tiZANidine (ZANAFLEX) 4 MG tablet Take 1 tablet (4 mg total) by mouth every 6 (six) hours as needed for muscle spasms. 11/03/20  Yes Bing Neighbors, FNP  doxycycline (VIBRAMYCIN) 100 MG capsule Take 1 capsule (100 mg total) by mouth 2 (two) times daily. 09/06/20   Bing Neighbors, FNP    Family History Family History  Problem Relation Age of Onset   Cancer Maternal Grandmother  Heart attack Maternal Grandfather    Diabetes Mother    Hypertension Mother    Hyperlipidemia Mother    Hypertension Maternal Aunt    Thyroid disease Cousin    ADD / ADHD Son    ODD Son    Other Daughter        lazy eye    Social History Social History   Tobacco Use   Smoking status: Every Day    Packs/day: 1.00    Years: 16.00    Pack years: 16.00    Types: Cigarettes   Smokeless tobacco: Never  Vaping Use   Vaping Use: Former  Substance Use Topics   Alcohol use: Not Currently   Drug use: Not Currently     Allergies   Elavil [amitriptyline] and Tramadol   Review of Systems Review of Systems Pertinent negatives listed in HPI   Physical Exam Triage Vital Signs ED Triage Vitals  Enc Vitals Group     BP 11/03/20 1223 (!) 161/85     Pulse Rate 11/03/20 1223 84     Resp 11/03/20 1223 (!) 85     Temp 11/03/20 1223 99.1 F (37.3 C)     Temp  src --      SpO2 11/03/20 1223 95 %     Weight --      Height --      Head Circumference --      Peak Flow --      Pain Score 11/03/20 1225 9     Pain Loc --      Pain Edu? --      Excl. in GC? --    No data found.  Updated Vital Signs BP (!) 161/85   Pulse 84   Temp 99.1 F (37.3 C)   Resp (!) 85   LMP 10/17/2020 (Exact Date)   SpO2 95%   Visual Acuity Right Eye Distance:   Left Eye Distance:   Bilateral Distance:    Right Eye Near:   Left Eye Near:    Bilateral Near:     Physical Exam General appearance: Alert, obese, cooperative, no distress Head: Normocephalic, without obvious abnormality, atraumatic Respiratory: Respirations even and unlabored, normal respiratory rate Heart: Rate and rhythm normal. No gallop or murmurs noted on exam  Extremities: No gross deformities Skin: Skin color, texture, turgor normal. No rashes seen  Psych: Appropriate mood and affect. Neurologic: GCS 15, normal coordination normal gait no focal obvious  abnormality UC Treatments / Results  Labs (all labs ordered are listed, but only abnormal results are displayed) Labs Reviewed  POCT URINALYSIS DIP (MANUAL ENTRY) - Abnormal; Notable for the following components:      Result Value   Blood, UA trace-intact (*)    All other components within normal limits  POCT URINE PREGNANCY    EKG   Radiology No results found.  Procedures Procedures (including critical care time)  Medications Ordered in UC Medications  ketorolac (TORADOL) 30 MG/ML injection 30 mg (30 mg Intramuscular Given 11/03/20 1410)    Initial Impression / Assessment and Plan / UC Course  I have reviewed the triage vital signs and the nursing notes.  Pertinent labs & imaging results that were available during my care of the patient were reviewed by me and considered in my medical decision making (see chart for details).  Clinical Course as of 11/03/20 1446  Tue Nov 03, 2020  1404 Respirations 18 [KH]     Clinical Course User Index [KH] Joaquin Courts  S, FNP   Acute low back pain with sciatica.  Treating with prednisone and tizanidine. Encouraged heat applications and continue Tylenol as needed for pain. Follow-up with orthopedics as needed if symptoms do not readily resolve. Final Clinical Impressions(s) / UC Diagnoses   Final diagnoses:  Acute bilateral low back pain with right-sided sciatica   Discharge Instructions   None    ED Prescriptions     Medication Sig Dispense Auth. Provider   predniSONE (DELTASONE) 20 MG tablet Take 2 tablets (40 mg total) by mouth daily with breakfast. 10 tablet Bing Neighbors, FNP   tiZANidine (ZANAFLEX) 4 MG tablet Take 1 tablet (4 mg total) by mouth every 6 (six) hours as needed for muscle spasms. 30 tablet Bing Neighbors, FNP      PDMP not reviewed this encounter.   Bing Neighbors, FNP 11/03/20 [phone]

## 2020-11-18 ENCOUNTER — Ambulatory Visit: Payer: Medicaid Other | Admitting: Adult Health

## 2020-11-26 ENCOUNTER — Ambulatory Visit: Payer: Medicaid Other | Admitting: Obstetrics & Gynecology

## 2020-12-02 ENCOUNTER — Encounter (HOSPITAL_COMMUNITY): Payer: Self-pay | Admitting: Emergency Medicine

## 2020-12-02 ENCOUNTER — Emergency Department (HOSPITAL_COMMUNITY)
Admission: EM | Admit: 2020-12-02 | Discharge: 2020-12-02 | Disposition: A | Discharge status: Home / Self Care | Payer: Medicaid Other | Attending: Emergency Medicine | Admitting: Emergency Medicine

## 2020-12-02 ENCOUNTER — Other Ambulatory Visit: Payer: Self-pay

## 2020-12-02 DIAGNOSIS — F17210 Nicotine dependence, cigarettes, uncomplicated: Secondary | ICD-10-CM | POA: Diagnosis not present

## 2020-12-02 DIAGNOSIS — M5441 Lumbago with sciatica, right side: Secondary | ICD-10-CM

## 2020-12-02 DIAGNOSIS — M545 Low back pain, unspecified: Secondary | ICD-10-CM | POA: Insufficient documentation

## 2020-12-02 DIAGNOSIS — G8929 Other chronic pain: Secondary | ICD-10-CM

## 2020-12-02 MED ORDER — LIDOCAINE 5 % EX PTCH: 1.0000 | MEDICATED_PATCH | CUTANEOUS | 0 refills | Status: DC

## 2020-12-02 MED ORDER — KETOROLAC TROMETHAMINE 30 MG/ML IJ SOLN
30.0000 mg | Freq: Once | INTRAMUSCULAR | Status: AC
  Administered 2020-12-02: 30 mg via INTRAMUSCULAR
  Filled 2020-12-02: qty 1

## 2020-12-02 MED ORDER — LIDOCAINE 5 % EX PTCH
2.0000 | MEDICATED_PATCH | CUTANEOUS | Status: DC
  Administered 2020-12-02: 2 via TRANSDERMAL
  Filled 2020-12-02: qty 2

## 2020-12-02 NOTE — Discharge Instructions (Addendum)
You were seen in the ER today for your chronic back pain.  There are no indications in her physical exam for CT scan or MRI.  You have been prescribed Lidoderm patches which you may use as needed for your pain.  May continue use over-the-counter medications for your pain.  Please do follow-up with your previously prescribed physical therapy and your orthopedist.  Return to the ER with any new numbness, tingling, weakness in your lower extremities or in your groin, he have any urinary or fecal incontinence, or any other new severe symptoms.

## 2020-12-02 NOTE — ED Provider Notes (Signed)
[facility] EMERGENCY DEPARTMENT Provider Note   CSN: [id number] Arrival date & time: 12/02/20  1012     History Chief Complaint  Patient presents with   Back Pain    Jermisha Cranmer is a 35 y.o. female With his chronic low back pain who presents requesting MRI of her spine.  States that it was going to be ordered by her orthopedist, however Medicaid would not cover it therefore she would like 1 today.  States that the Tylenol ibuprofen helped her pain but she is tired of being in pain.  Has physical therapy scheduled but has not had her first session yet.  Denies any saddle anesthesia, numbness, weakness in the legs, urinary or fecal incontinence.  Does intermittently have tingling sensation on the back of the right leg secondary to sciatica.  Has been on multiple courses of prednisone in the last month for lumbar radiculopathy.  I personally viewed this patient's medical records.  She isA 35 year old female with depression, anxiety, ADHD, obesity, and chronic low back pain. No hx of IV drug use, no trauma, no fevers or chills. HPI     Past Medical History:  Diagnosis Date   ADHD    Anxiety    Depression    Papanicolaou smear of cervix with positive high risk human papilloma virus (HPV) test 09/24/2020   09/24/20 repeat in 1 year per ASCCP guidelines, 5 year risk of CIN 3+ is 4.8%   PTSD (post-traumatic stress disorder)     Patient Active Problem List   Diagnosis Date Noted   Papanicolaou smear of cervix with positive high risk human papilloma virus (HPV) test 09/24/2020   Menorrhagia with regular cycle 09/16/2020   General counseling and advice for contraceptive management 09/16/2020   Encounter for gynecological examination with Papanicolaou smear of cervix 09/16/2020   Routine general medical examination at a health care facility 09/16/2020    Past Surgical History:  Procedure Laterality Date   APPENDECTOMY     CHOLECYSTECTOMY     WRIST FRACTURE SURGERY Right 2021     OB  History     Gravida  3   Para  3   Term  3   Preterm      AB      Living  3      SAB      IAB      Ectopic      Multiple      Live Births  3           Family History  Problem Relation Age of Onset   Cancer Maternal Grandmother    Heart attack Maternal Grandfather    Diabetes Mother    Hypertension Mother    Hyperlipidemia Mother    Hypertension Maternal Aunt    Thyroid disease Cousin    ADD / ADHD Son    ODD Son    Other Daughter        lazy eye    Social History   Tobacco Use   Smoking status: Every Day    Packs/day: 1.00    Years: 16.00    Pack years: 16.00    Types: Cigarettes   Smokeless tobacco: Never  Vaping Use   Vaping Use: Former  Substance Use Topics   Alcohol use: Not Currently   Drug use: Not Currently    Home Medications Prior to Admission medications   Medication Sig Start Date End Date Taking? Authorizing Provider  albuterol (VENTOLIN HFA) 108 (90 Base) MCG/ACT inhaler  Inhale 2 puffs into the lungs every 6 (six) hours as needed for wheezing or shortness of breath. 09/06/20   Bing Neighbors, FNP  cetirizine (ZYRTEC) 10 MG tablet Take 10 mg by mouth daily.    [provider]  diphenhydrAMINE (BENADRYL) 25 MG tablet Take 25 mg by mouth daily as needed for allergies.    [provider]  doxycycline (VIBRAMYCIN) 100 MG capsule Take 1 capsule (100 mg total) by mouth 2 (two) times daily. 09/06/20   Bing Neighbors, FNP  FLUoxetine (PROZAC) 20 MG capsule Take 20 mg by mouth every morning. 08/12/19   [provider]  predniSONE (DELTASONE) 20 MG tablet Take 2 tablets (40 mg total) by mouth daily with breakfast. 11/03/20   Bing Neighbors, FNP  tiZANidine (ZANAFLEX) 4 MG tablet Take 1 tablet (4 mg total) by mouth every 6 (six) hours as needed for muscle spasms. 11/03/20   Bing Neighbors, FNP    Allergies    Elavil [amitriptyline] and Tramadol  Review of Systems   Review of Systems  Constitutional:  Negative.   HENT: Negative.    Respiratory: Negative.    Cardiovascular: Negative.   Gastrointestinal: Negative.   Musculoskeletal:  Positive for back pain. Negative for myalgias, neck pain and neck stiffness.  Skin: Negative.   Neurological:  Negative for dizziness, tremors, syncope, facial asymmetry, light-headedness and headaches.       NO saddle anesthesia, urinary incontinence or fecal incontinence   Physical Exam Updated Vital Signs BP (!) 137/100 (BP Location: Right Arm)   Pulse 79   Temp 98.2 F (36.8 C) (Oral)   Resp 18   Ht 5\' 9"  (1.753 m)   Wt 108.9 kg   LMP 11/15/2020   SpO2 98%   BMI 35.44 kg/m   Physical Exam Vitals and nursing note reviewed.  HENT:     Head: Normocephalic and atraumatic.     Mouth/Throat:     Mouth: Mucous membranes are moist.     Pharynx: No oropharyngeal exudate or posterior oropharyngeal erythema.  Eyes:     General:        Right eye: No discharge.        Left eye: No discharge.     Conjunctiva/sclera: Conjunctivae normal.  Cardiovascular:     Rate and Rhythm: Normal rate and regular rhythm.     Pulses: Normal pulses.  Pulmonary:     Effort: Pulmonary effort is normal. No respiratory distress.     Breath sounds: Normal breath sounds. No wheezing or rales.  Abdominal:     General: Bowel sounds are normal. There is no distension.     Tenderness: There is no abdominal tenderness.  Musculoskeletal:        General: No deformity.     Cervical back: Neck supple. No bony tenderness.     Thoracic back: No bony tenderness.     Lumbar back: Spasms, tenderness and bony tenderness present. Positive right straight leg raise test. Negative left straight leg raise test.       Back:  Skin:    General: Skin is warm and dry.     Capillary Refill: Capillary refill takes less than 2 seconds.  Neurological:     General: No focal deficit present.     Mental Status: She is alert and oriented to person, place, and time. Mental status is at baseline.   Psychiatric:        Mood and Affect: Mood normal.     ED  Results / Procedures / Treatments   Labs (all labs ordered are listed, but only abnormal results are displayed) Labs Reviewed - No data to display  EKG None  Radiology No results found.  Procedures Procedures   Medications Ordered in ED Medications  lidocaine (LIDODERM) 5 % 2 patch (has no administration in time range)  ketorolac (TORADOL) 30 MG/ML injection 30 mg (has no administration in time range)    ED Course  I have reviewed the triage vital signs and the nursing notes.  Pertinent labs & imaging results that were available during my care of the patient were reviewed by me and considered in my medical decision making (see chart for details).    MDM Rules/Calculators/A&P                          35 year old female with chronic low back pain who presents today with concern for pain and request of MRI.   The emergent differential diagnosis for low back pain includes acute ligamentous and muscular injury, cord compression syndrome, pathologic fracture, transverse myelitis, vertebral osteomyelitis, discitis, and epidural abscess.  Hypertensive on intake, vital signs otherwise normal, afebrile.  Cardiopulmonary exam is normal, abdominal exam is benign. Tenderness palpation of the lumbar spine and right lumbar paraspinous musculature.  No saddle anesthesia.  Patient is ambulatory and has symmetric strength and sensation in the lower extremities bilaterally.  Patient without risk factors for cord injury or epidural abscess/hematoma.  As she is afebrile and has reassuring vital signs, we will not proceed with work-up for infectious etiology.  Suspect her symptoms are secondary to her chronic back pain.  She has been on multiple courses of steroids recently, therefore we will not prescribe prednisone today.  Will offer Lidoderm patch and Toradol recommend follow-up closely with her orthopedic in the outpatient setting.   Also encouraged her to proceed with physical therapy as previously prescribed.  No further work-up including imaging warranted in the emergency department this time given reassuring physical exam and vital signs.  No signs of cord compression or cauda equina syndrome.  Zola voiced understanding for medical evaluation and treatment plan.  For questions was answered to her expressed satisfaction.  Return precautions were given.  Patient is well-appearing, stable, and appropriate for discharge at this time.  This chart was dictated using voice recognition software, Dragon. Despite the best efforts of this provider to proofread and correct errors, errors may still occur which can change documentation meaning.  Final Clinical Impression(s) / ED Diagnoses Final diagnoses:  None    Rx / DC Orders ED Discharge Orders     None        Sherrilee Gilles 12/02/20 1440    Gerhard Munch, MD 12/03/20 [phone]

## 2020-12-02 NOTE — ED Notes (Signed)
Patient states that she was at home cleaning and she bent over and the pain was so intense.  States that she fell, but caught herself.

## 2020-12-02 NOTE — ED Triage Notes (Addendum)
Low back pain x 1 year that has been getting worse the past 6 months.  Has been taking tylenol and ibuprofen with minimal relief. Pt seen at ortho but they will not order a MRI because medicaid will not pay.  Would like MRI today.

## 2021-03-10 ENCOUNTER — Emergency Department (HOSPITAL_COMMUNITY)
Admission: EM | Admit: 2021-03-10 | Discharge: 2021-03-10 | Disposition: A | Discharge status: Home / Self Care | Payer: Medicaid Other | Attending: Emergency Medicine | Admitting: Emergency Medicine

## 2021-03-10 ENCOUNTER — Other Ambulatory Visit: Payer: Self-pay

## 2021-03-10 ENCOUNTER — Encounter (HOSPITAL_COMMUNITY): Payer: Self-pay | Admitting: Emergency Medicine

## 2021-03-10 DIAGNOSIS — Z20822 Contact with and (suspected) exposure to covid-19: Secondary | ICD-10-CM | POA: Diagnosis not present

## 2021-03-10 DIAGNOSIS — J069 Acute upper respiratory infection, unspecified: Secondary | ICD-10-CM | POA: Insufficient documentation

## 2021-03-10 DIAGNOSIS — F17210 Nicotine dependence, cigarettes, uncomplicated: Secondary | ICD-10-CM | POA: Insufficient documentation

## 2021-03-10 DIAGNOSIS — R059 Cough, unspecified: Secondary | ICD-10-CM | POA: Diagnosis present

## 2021-03-10 LAB — RESP PANEL BY RT-PCR (FLU A&B, COVID) ARPGX2
Influenza A by PCR: NEGATIVE
Influenza B by PCR: NEGATIVE
SARS Coronavirus 2 by RT PCR: NEGATIVE

## 2021-03-10 MED ORDER — BENZONATATE 200 MG PO CAPS: 200.0000 mg | ORAL_CAPSULE | Freq: Three times a day (TID) | ORAL | 0 refills | Status: DC | PRN

## 2021-03-10 MED ORDER — ALBUTEROL SULFATE HFA 108 (90 BASE) MCG/ACT IN AERS
2.0000 | INHALATION_SPRAY | Freq: Once | RESPIRATORY_TRACT | Status: AC
  Administered 2021-03-10: 2 via RESPIRATORY_TRACT
  Filled 2021-03-10: qty 6.7

## 2021-03-10 MED ORDER — PREDNISONE 20 MG PO TABS: 40.0000 mg | ORAL_TABLET | Freq: Every day | ORAL | 0 refills | Status: DC

## 2021-03-10 NOTE — ED Triage Notes (Signed)
C/o cough for last 4 days.  C/o sore on gum (left upper).

## 2021-03-10 NOTE — Discharge Instructions (Signed)
1 to 2 puffs of the albuterol every 4-6 hours as needed for wheezing.  Take prednisone as directed until its finished.  You have been prescribed medication to help with your cough as well.  You may take Tylenol every 4 hours if needed for fever.  Drink plenty of fluids.  Follow-up with your primary care provider for recheck.  Return emergency department for any new or worsening symptoms.

## 2021-03-11 NOTE — ED Provider Notes (Signed)
[facility] EMERGENCY DEPARTMENT Provider Note   CSN: [id number] Arrival date & time: 03/10/21  1043     History Chief Complaint  Patient presents with   Cough    Jill Mosley is a 35 y.o. female.   Cough Associated symptoms: wheezing   Associated symptoms: no chest pain, no chills, no ear pain, no fever, no headaches, no rash, no shortness of breath and no sore throat       Jill Mosley is a 35 y.o. female who presents to the Emergency Department complaining of cough for 4 days.  She describes cough that is mostly non productive.  Cough has been associated with wheezing and she states she has ran out of her albuterol inhaler. She denies chest pain, shortness of breath and fever.   She also reports tenderness of the left upper gums and left upper tooth.  She denies neck pain, difficulty swallowing, facial swelling and difficulty opening and closing her mouth.     Past Medical History:  Diagnosis Date   ADHD    Anxiety    Depression    Papanicolaou smear of cervix with positive high risk human papilloma virus (HPV) test 09/24/2020   09/24/20 repeat in 1 year per ASCCP guidelines, 5 year risk of CIN 3+ is 4.8%   PTSD (post-traumatic stress disorder)     Patient Active Problem List   Diagnosis Date Noted   Papanicolaou smear of cervix with positive high risk human papilloma virus (HPV) test 09/24/2020   Menorrhagia with regular cycle 09/16/2020   General counseling and advice for contraceptive management 09/16/2020   Encounter for gynecological examination with Papanicolaou smear of cervix 09/16/2020   Routine general medical examination at a health care facility 09/16/2020    Past Surgical History:  Procedure Laterality Date   APPENDECTOMY     CHOLECYSTECTOMY     WRIST FRACTURE SURGERY Right 2021     OB History     Gravida  3   Para  3   Term  3   Preterm      AB      Living  3      SAB      IAB      Ectopic      Multiple      Live Births  3            Family History  Problem Relation Age of Onset   Cancer Maternal Grandmother    Heart attack Maternal Grandfather    Diabetes Mother    Hypertension Mother    Hyperlipidemia Mother    Hypertension Maternal Aunt    Thyroid disease Cousin    ADD / ADHD Son    ODD Son    Other Daughter        lazy eye    Social History   Tobacco Use   Smoking status: Every Day    Packs/day: 1.00    Years: 16.00    Pack years: 16.00    Types: Cigarettes   Smokeless tobacco: Never  Vaping Use   Vaping Use: Former  Substance Use Topics   Alcohol use: Not Currently   Drug use: Not Currently    Home Medications Prior to Admission medications   Medication Sig Start Date End Date Taking? Authorizing Provider  benzonatate (TESSALON) 200 MG capsule Take 1 capsule (200 mg total) by mouth 3 (three) times daily as needed for cough. Swallow whole, do not chew 03/10/21  Yes Quavis Klutz, PA-C  predniSONE (DELTASONE) 20 MG tablet Take 2 tablets (40 mg total) by mouth daily. 03/10/21  Yes Perfecto Purdy, PA-C  albuterol (VENTOLIN HFA) 108 (90 Base) MCG/ACT inhaler Inhale 2 puffs into the lungs every 6 (six) hours as needed for wheezing or shortness of breath. 09/06/20   Bing Neighbors, FNP  cetirizine (ZYRTEC) 10 MG tablet Take 10 mg by mouth daily.    [provider]  diphenhydrAMINE (BENADRYL) 25 MG tablet Take 25 mg by mouth daily as needed for allergies.    [provider]  doxycycline (VIBRAMYCIN) 100 MG capsule Take 1 capsule (100 mg total) by mouth 2 (two) times daily. 09/06/20   Bing Neighbors, FNP  FLUoxetine (PROZAC) 20 MG capsule Take 20 mg by mouth every morning. 08/12/19   [provider]  lidocaine (LIDODERM) 5 % Place 1 patch onto the skin daily. Remove & Discard patch within 12 hours or as directed by MD 12/02/20   Sponseller, Lupe Carney R, PA-C  tiZANidine (ZANAFLEX) 4 MG tablet Take 1 tablet (4 mg total) by mouth every 6 (six) hours as needed for  muscle spasms. 11/03/20   Bing Neighbors, FNP    Allergies    Elavil [amitriptyline] and Tramadol  Review of Systems   Review of Systems  Constitutional:  Negative for chills and fever.  HENT:  Positive for dental problem. Negative for congestion, ear pain, sore throat and trouble swallowing.   Eyes:  Negative for pain and visual disturbance.  Respiratory:  Positive for cough and wheezing. Negative for shortness of breath.   Cardiovascular:  Negative for chest pain and palpitations.  Gastrointestinal:  Negative for abdominal pain, nausea and vomiting.  Genitourinary:  Negative for dysuria and hematuria.  Musculoskeletal:  Negative for arthralgias, neck pain and neck stiffness.  Skin:  Negative for color change and rash.  Neurological:  Negative for dizziness, seizures, syncope and headaches.  Psychiatric/Behavioral:  Negative for confusion.   All other systems reviewed and are negative.  Physical Exam Updated Vital Signs BP (!) 142/90 (BP Location: Left Arm)   Pulse 81   Temp 98.8 F (37.1 C)   Resp 18   Ht 5\' 9"  (1.753 m)   Wt 108.9 kg   LMP 02/09/2021   SpO2 94%   BMI 35.44 kg/m   Physical Exam Vitals and nursing note reviewed.  Constitutional:      Appearance: Normal appearance. She is not ill-appearing or toxic-appearing.  HENT:     Head: Normocephalic.     Right Ear: Tympanic membrane and ear canal normal.     Left Ear: Tympanic membrane and ear canal normal.     Mouth/Throat:     Mouth: Mucous membranes are moist. No oral lesions.     Dentition: No dental tenderness, gingival swelling, dental caries or dental abscesses.     Pharynx: Oropharynx is clear. Uvula midline. No pharyngeal swelling, posterior oropharyngeal erythema or uvula swelling.     Tonsils: No tonsillar exudate or tonsillar abscesses.     Comments: Tenderness of the gingiva of the left upper teeth.  No induration or edema.  No dental tenderness. Uvula is midline and non edematous.  Eyes:      Pupils: Pupils are equal, round, and reactive to light.  Neck:     Thyroid: No thyromegaly.     Meningeal: Kernig's sign absent.  Cardiovascular:     Rate and Rhythm: Normal rate and regular rhythm.     Pulses: Normal pulses.  Pulmonary:  Effort: Pulmonary effort is normal. No respiratory distress.     Breath sounds: No stridor. Wheezing present. No rhonchi.  Abdominal:     Palpations: Abdomen is soft.     Tenderness: There is no abdominal tenderness.  Musculoskeletal:        General: Normal range of motion.     Cervical back: Normal range of motion.     Right lower leg: No edema.     Left lower leg: No edema.  Skin:    General: Skin is warm.     Findings: No rash.  Neurological:     General: No focal deficit present.     Mental Status: She is alert.     Sensory: No sensory deficit.     Motor: No weakness.    ED Results / Procedures / Treatments   Labs (all labs ordered are listed, but only abnormal results are displayed) Labs Reviewed  RESP PANEL BY RT-PCR (FLU A&B, COVID) [id number]    EKG None  Radiology No results found.  Procedures Procedures   Medications Ordered in ED Medications  albuterol (VENTOLIN HFA) 108 (90 Base) MCG/ACT inhaler 2 puff (2 puffs Inhalation Given 03/10/21 1348)    ED Course  I have reviewed the triage vital signs and the nursing notes.  Pertinent labs & imaging results that were available during my care of the patient were reviewed by me and considered in my medical decision making (see chart for details).    MDM Rules/Calculators/A&P                           Pt here with cough and wheezing.  Ran out of her albuterol inhaler.  Has some tenderness of the left upper gums.  No edema or dental tenderness.  No concerning sx's for ludewig's angina.    Covid and influenza testing negative.  Vitals reassuring.  Pt given albuterol inhaler, ambulates in the dept w/o difficulty,  no concerning sx's for PNA or PE.   Pt appears appropriate  for dd/c home.  Rx for prednisone and tessalon.  Return precautions discussed.    Final Clinical Impression(s) / ED Diagnoses Final diagnoses:  Viral URI with cough    Rx / DC Orders ED Discharge Orders          Ordered    benzonatate (TESSALON) 200 MG capsule  3 times daily PRN        03/10/21 1400    predniSONE (DELTASONE) 20 MG tablet  Daily        03/10/21 1400             Pauline Aus, PA-C 03/11/21 2338    Cheryll Cockayne, MD 03/12/21 1626

## 2021-05-13 ENCOUNTER — Other Ambulatory Visit: Payer: Self-pay

## 2021-05-13 ENCOUNTER — Ambulatory Visit
Admission: EM | Admit: 2021-05-13 | Discharge: 2021-05-13 | Disposition: A | Discharge status: Home / Self Care | Payer: Medicaid Other | Attending: Urgent Care | Admitting: Urgent Care

## 2021-05-13 DIAGNOSIS — B349 Viral infection, unspecified: Secondary | ICD-10-CM | POA: Diagnosis not present

## 2021-05-13 DIAGNOSIS — F17200 Nicotine dependence, unspecified, uncomplicated: Secondary | ICD-10-CM

## 2021-05-13 DIAGNOSIS — R062 Wheezing: Secondary | ICD-10-CM

## 2021-05-13 DIAGNOSIS — J45909 Unspecified asthma, uncomplicated: Secondary | ICD-10-CM

## 2021-05-13 DIAGNOSIS — R590 Localized enlarged lymph nodes: Secondary | ICD-10-CM

## 2021-05-13 MED ORDER — ALBUTEROL SULFATE HFA 108 (90 BASE) MCG/ACT IN AERS: 2.0000 | INHALATION_SPRAY | Freq: Four times a day (QID) | RESPIRATORY_TRACT | 0 refills | Status: AC | PRN

## 2021-05-13 MED ORDER — PREDNISONE 20 MG PO TABS: 40.0000 mg | ORAL_TABLET | Freq: Every day | ORAL | 0 refills | Status: DC

## 2021-05-13 MED ORDER — BENZONATATE 100 MG PO CAPS: 100.0000 mg | ORAL_CAPSULE | Freq: Three times a day (TID) | ORAL | 0 refills | Status: DC | PRN

## 2021-05-13 MED ORDER — PROMETHAZINE-DM 6.25-15 MG/5ML PO SYRP: 5.0000 mL | ORAL_SOLUTION | Freq: Every evening | ORAL | 0 refills | Status: DC | PRN

## 2021-05-13 NOTE — ED Triage Notes (Signed)
Pt reports gland swollen on right side of neck, started this morning; cough x 1 day; cough x 1 day.

## 2021-05-13 NOTE — ED Provider Notes (Signed)
[facility]   MRN: [id number] DOB: 1985-11-07  Subjective:   Jill Mosley is a 36 y.o. female presenting for 1 day history of acute onset recurrent coughing, chest tightness, wheezing, swollen glands on the right side of her neck.  Has had 1 sick contact with her son.  Patient is still smoking, does 1 pack/day.  Needs a refill on her albuterol inhaler.  No chest pain, shortness of breath.  No current facility-administered medications for this encounter.  Current Outpatient Medications:    albuterol (VENTOLIN HFA) 108 (90 Base) MCG/ACT inhaler, Inhale 2 puffs into the lungs every 6 (six) hours as needed for wheezing or shortness of breath., Disp: 8 g, Rfl: 2   benzonatate (TESSALON) 200 MG capsule, Take 1 capsule (200 mg total) by mouth 3 (three) times daily as needed for cough. Swallow whole, do not chew, Disp: 21 capsule, Rfl: 0   cetirizine (ZYRTEC) 10 MG tablet, Take 10 mg by mouth daily., Disp: , Rfl:    diphenhydrAMINE (BENADRYL) 25 MG tablet, Take 25 mg by mouth daily as needed for allergies., Disp: , Rfl:    doxycycline (VIBRAMYCIN) 100 MG capsule, Take 1 capsule (100 mg total) by mouth 2 (two) times daily., Disp: 20 capsule, Rfl: 0   FLUoxetine (PROZAC) 20 MG capsule, Take 20 mg by mouth every morning., Disp: , Rfl:    lidocaine (LIDODERM) 5 %, Place 1 patch onto the skin daily. Remove & Discard patch within 12 hours or as directed by MD, Disp: 30 patch, Rfl: 0   predniSONE (DELTASONE) 20 MG tablet, Take 2 tablets (40 mg total) by mouth daily., Disp: 10 tablet, Rfl: 0   tiZANidine (ZANAFLEX) 4 MG tablet, Take 1 tablet (4 mg total) by mouth every 6 (six) hours as needed for muscle spasms., Disp: 30 tablet, Rfl: 0   Allergies  Allergen Reactions   Elavil [Amitriptyline] Other (See Comments)    "Can't stay out of the dreams"   Tramadol Rash    Past Medical History:  Diagnosis Date   ADHD    Anxiety    Depression    Papanicolaou smear of cervix with positive  high risk human papilloma virus (HPV) test 09/24/2020   09/24/20 repeat in 1 year per ASCCP guidelines, 5 year risk of CIN 3+ is 4.8%   PTSD (post-traumatic stress disorder)      Past Surgical History:  Procedure Laterality Date   APPENDECTOMY     CHOLECYSTECTOMY     WRIST FRACTURE SURGERY Right 2021    Family History  Problem Relation Age of Onset   Cancer Maternal Grandmother    Heart attack Maternal Grandfather    Diabetes Mother    Hypertension Mother    Hyperlipidemia Mother    Hypertension Maternal Aunt    Thyroid disease Cousin    ADD / ADHD Son    ODD Son    Other Daughter        lazy eye    Social History   Tobacco Use   Smoking status: Every Day    Packs/day: 1.00    Years: 16.00    Pack years: 16.00    Types: Cigarettes   Smokeless tobacco: Never  Vaping Use   Vaping Use: Former  Substance Use Topics   Alcohol use: Not Currently   Drug use: Not Currently    ROS   Objective:   Vitals: BP (!) 160/101 (BP Location: Left Arm)    Pulse 82    Temp 98.5 F (36.9  C) (Oral)    Resp 18    LMP 04/11/2021 (Exact Date)    SpO2 96%   Physical Exam Constitutional:      General: She is not in acute distress.    Appearance: Normal appearance. She is well-developed and normal weight. She is not ill-appearing, toxic-appearing or diaphoretic.  HENT:     Head: Normocephalic and atraumatic.     Right Ear: Tympanic membrane, ear canal and external ear normal. No drainage or tenderness. No middle ear effusion. There is no impacted cerumen. Tympanic membrane is not erythematous.     Left Ear: Tympanic membrane, ear canal and external ear normal. No drainage or tenderness.  No middle ear effusion. There is no impacted cerumen. Tympanic membrane is not erythematous.     Nose: Congestion present. No rhinorrhea.     Mouth/Throat:     Mouth: Mucous membranes are moist. No oral lesions.     Pharynx: No pharyngeal swelling, oropharyngeal exudate, posterior oropharyngeal  erythema or uvula swelling.     Tonsils: No tonsillar exudate or tonsillar abscesses.  Eyes:     General: No scleral icterus.       Right eye: No discharge.        Left eye: No discharge.     Extraocular Movements: Extraocular movements intact.     Right eye: Normal extraocular motion.     Left eye: Normal extraocular motion.     Conjunctiva/sclera: Conjunctivae normal.  Cardiovascular:     Rate and Rhythm: Normal rate.     Heart sounds: No murmur heard.   No friction rub. No gallop.  Pulmonary:     Effort: Pulmonary effort is normal. No respiratory distress.     Breath sounds: No stridor. No wheezing, rhonchi or rales.  Chest:     Chest wall: No tenderness.  Musculoskeletal:     Cervical back: Normal range of motion and neck supple.  Lymphadenopathy:     Cervical: Cervical adenopathy (right sided) present.  Skin:    General: Skin is warm and dry.  Neurological:     General: No focal deficit present.     Mental Status: She is alert and oriented to person, place, and time.  Psychiatric:        Mood and Affect: Mood normal.        Behavior: Behavior normal.    Assessment and Plan :   PDMP not reviewed this encounter.  1. Acute viral syndrome   2. Moderate asthma without complication, unspecified whether persistent   3. Wheezing   4. Smoker   5. Cervical lymphadenopathy     Deferred imaging given clear cardiopulmonary exam, hemodynamically stable vital signs. Recommended an oral prednisone course in the context of her asthma, smoking.  Use supportive care otherwise for what I suspect is a viral syndrome.  COVID-19 testing pending. Counseled patient on potential for adverse effects with medications prescribed/recommended today, ER and return-to-clinic precautions discussed, patient verbalized understanding.    Jaynee Eagles, PA-C 05/13/21 1233

## 2021-05-14 LAB — NOVEL CORONAVIRUS, NAA: SARS-CoV-2, NAA: NOT DETECTED

## 2021-11-06 IMAGING — DX DG LUMBAR SPINE COMPLETE 4+V
5 series · 5 of 5 positions shown · non-contrast
Comparison: None.

CLINICAL DATA: Low back pain after fall 1.5 months ago

EXAM:
LUMBAR SPINE - COMPLETE 4+ VIEW

[l-spine ap]
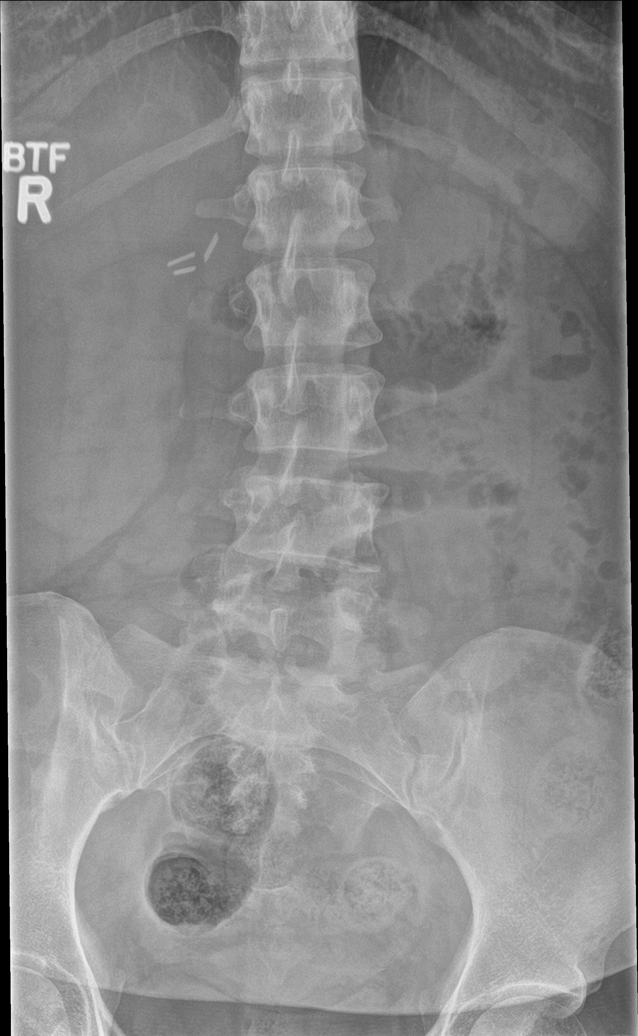

[l-spine obl (1 of 2)]
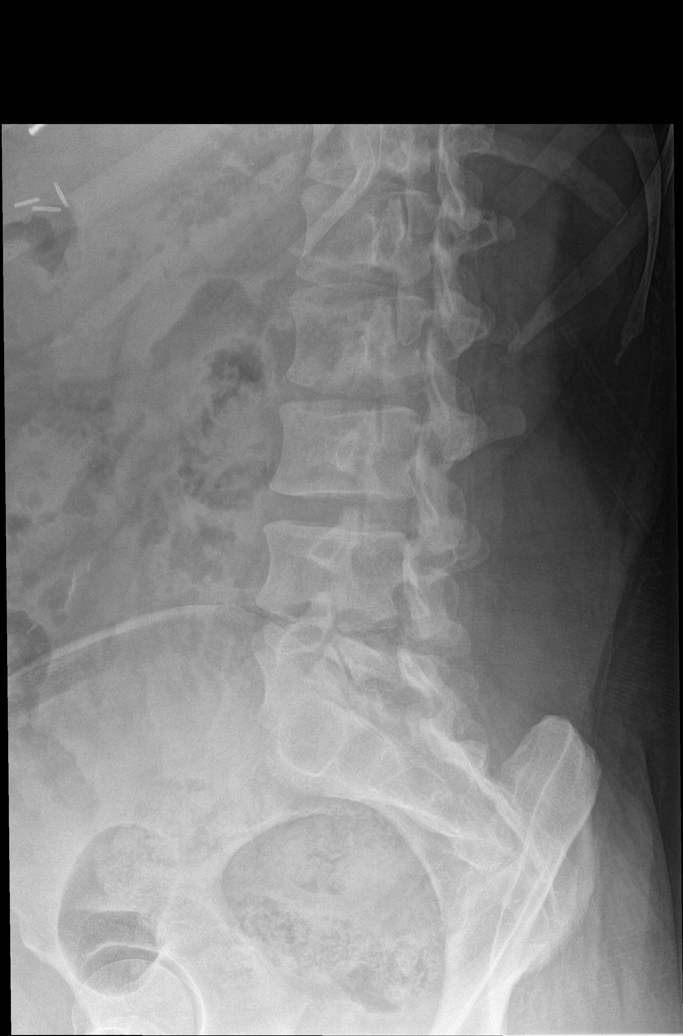

[l-spine lat]
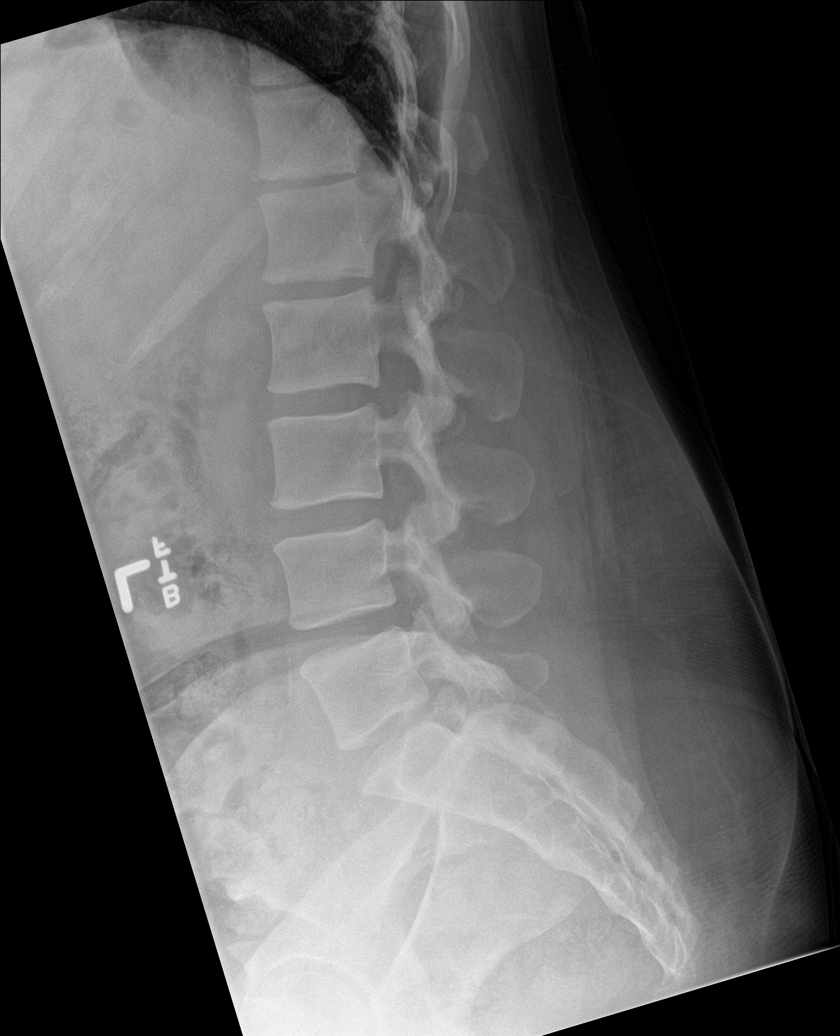

[l-spine spot]
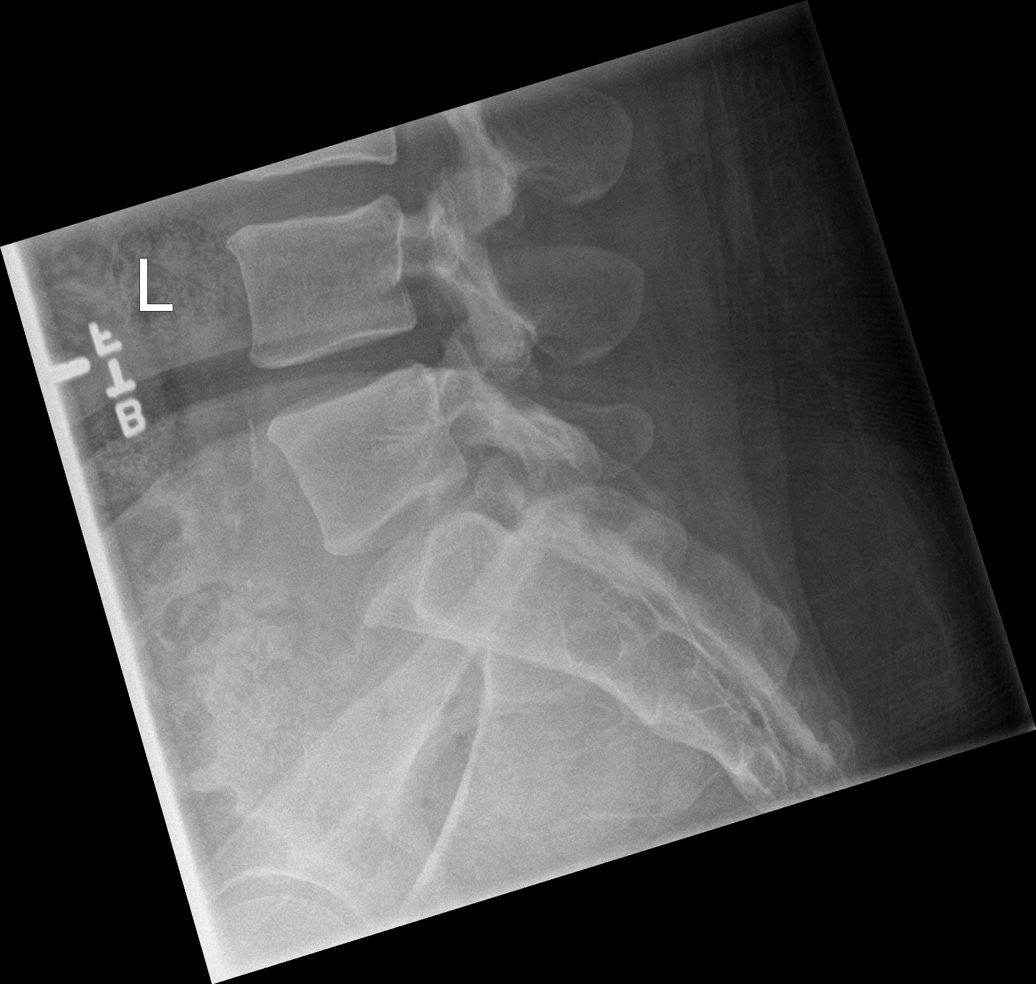

[l-spine obl (2 of 2)]
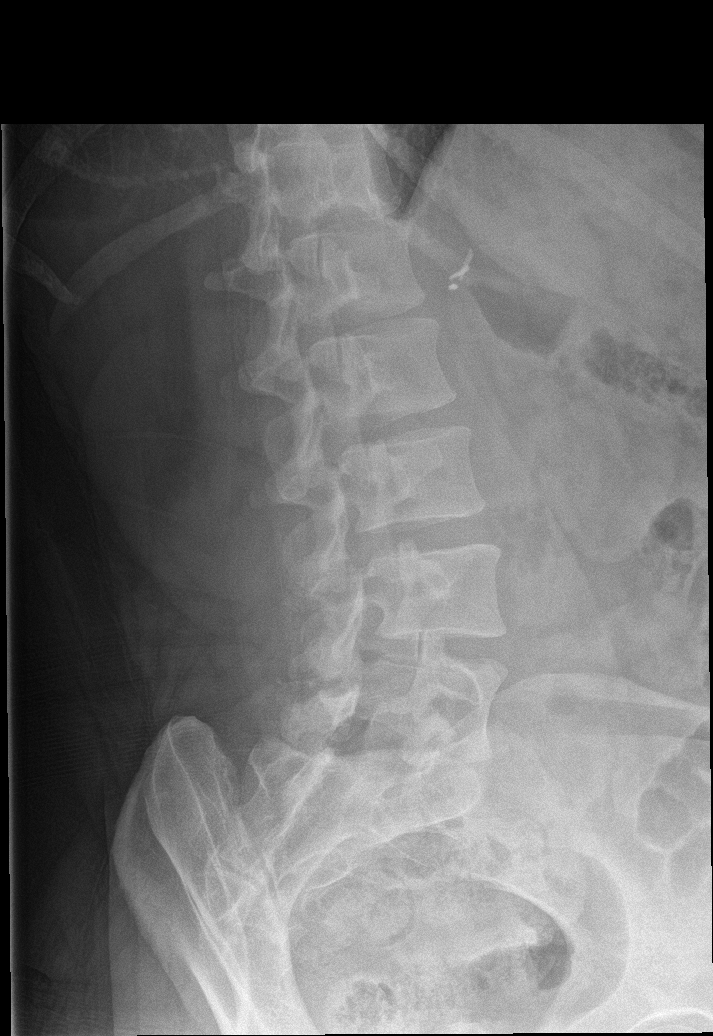

[5 of 5 positions shown; findings below may reference images not displayed]

FINDINGS: There is no evidence of lumbar spine fracture. Alignment is normal.
Intervertebral disc spaces are maintained.
IMPRESSION: Negative.

## 2021-11-29 ENCOUNTER — Ambulatory Visit (INDEPENDENT_AMBULATORY_CARE_PROVIDER_SITE_OTHER): Payer: Medicaid Other

## 2021-11-29 ENCOUNTER — Ambulatory Visit
Admission: EM | Admit: 2021-11-29 | Discharge: 2021-11-29 | Disposition: A | Discharge status: Home / Self Care | Payer: Medicaid Other | Attending: Nurse Practitioner | Admitting: Nurse Practitioner

## 2021-11-29 DIAGNOSIS — M79651 Pain in right thigh: Secondary | ICD-10-CM

## 2021-11-29 DIAGNOSIS — M79604 Pain in right leg: Secondary | ICD-10-CM

## 2021-11-29 DIAGNOSIS — M25551 Pain in right hip: Secondary | ICD-10-CM | POA: Diagnosis not present

## 2021-11-29 MED ORDER — DEXAMETHASONE SODIUM PHOSPHATE 10 MG/ML IJ SOLN
10.0000 mg | INTRAMUSCULAR | Status: AC
  Administered 2021-11-29: 10 mg via INTRAMUSCULAR

## 2021-11-29 MED ORDER — KETOROLAC TROMETHAMINE 30 MG/ML IJ SOLN
30.0000 mg | Freq: Once | INTRAMUSCULAR | Status: AC
  Administered 2021-11-29: 30 mg via INTRAMUSCULAR

## 2021-11-29 NOTE — ED Provider Notes (Signed)
[facility]    CSN: [id number] Arrival date & time: 11/29/21  5784      History   Chief Complaint Chief Complaint  Patient presents with   Leg Pain    HPI Jill Mosley is a 36 y.o. female.   The history is provided by the patient.   The patient presents for complaints of right hip/thigh pain that started approximately 4 days ago.  Patient states that "I think I broke my leg".  She cannot recall any specific injury or trauma.  Patient states that she has tenderness to the outside of the right hip that radiates into the right thigh.  She denies fever, chills, swelling, numbness, tingling, loss of bowel or bladder function, or low back pain.  Patient reports that she does have a history of sciatica, but states that her symptoms have never been this bad.  She states "my symptoms feel different".  Patient states she is unable to walk due to the pain in her right leg at this time.  Past Medical History:  Diagnosis Date   ADHD    Anxiety    Depression    Papanicolaou smear of cervix with positive high risk human papilloma virus (HPV) test 09/24/2020   09/24/20 repeat in 1 year per ASCCP guidelines, 5 year risk of CIN 3+ is 4.8%   PTSD (post-traumatic stress disorder)     Patient Active Problem List   Diagnosis Date Noted   Papanicolaou smear of cervix with positive high risk human papilloma virus (HPV) test 09/24/2020   Menorrhagia with regular cycle 09/16/2020   General counseling and advice for contraceptive management 09/16/2020   Encounter for gynecological examination with Papanicolaou smear of cervix 09/16/2020   Routine general medical examination at a health care facility 09/16/2020    Past Surgical History:  Procedure Laterality Date   APPENDECTOMY     CHOLECYSTECTOMY     WRIST FRACTURE SURGERY Right 2021    OB History     Gravida  3   Para  3   Term  3   Preterm      AB      Living  3      SAB      IAB      Ectopic      Multiple       Live Births  3            Home Medications    Prior to Admission medications   Medication Sig Start Date End Date Taking? Authorizing Provider  albuterol (VENTOLIN HFA) 108 (90 Base) MCG/ACT inhaler Inhale 2 puffs into the lungs every 6 (six) hours as needed for wheezing or shortness of breath. 05/13/21   Wallis Bamberg, PA-C  benzonatate (TESSALON) 100 MG capsule Take 1-2 capsules (100-200 mg total) by mouth 3 (three) times daily as needed for cough. 05/13/21   Wallis Bamberg, PA-C  cetirizine (ZYRTEC) 10 MG tablet Take 10 mg by mouth daily.    [provider]  diphenhydrAMINE (BENADRYL) 25 MG tablet Take 25 mg by mouth daily as needed for allergies.    [provider]  doxycycline (VIBRAMYCIN) 100 MG capsule Take 1 capsule (100 mg total) by mouth 2 (two) times daily. 09/06/20   Bing Neighbors, FNP  FLUoxetine (PROZAC) 20 MG capsule Take 20 mg by mouth every morning. 08/12/19   [provider]  lidocaine (LIDODERM) 5 % Place 1 patch onto the skin daily. Remove & Discard patch within 12 hours  or as directed by MD 12/02/20   Sponseller, Eugene Gavia, PA-C  predniSONE (DELTASONE) 20 MG tablet Take 2 tablets (40 mg total) by mouth daily. 05/13/21   Wallis Bamberg, PA-C  promethazine-dextromethorphan (PROMETHAZINE-DM) 6.25-15 MG/5ML syrup Take 5 mLs by mouth at bedtime as needed for cough. 05/13/21   Wallis Bamberg, PA-C  tiZANidine (ZANAFLEX) 4 MG tablet Take 1 tablet (4 mg total) by mouth every 6 (six) hours as needed for muscle spasms. 11/03/20   Bing Neighbors, FNP    Family History Family History  Problem Relation Age of Onset   Cancer Maternal Grandmother    Heart attack Maternal Grandfather    Diabetes Mother    Hypertension Mother    Hyperlipidemia Mother    Hypertension Maternal Aunt    Thyroid disease Cousin    ADD / ADHD Son    ODD Son    Other Daughter        lazy eye    Social History Social History   Tobacco Use   Smoking status: Every Day    Packs/day:  1.00    Years: 16.00    Total pack years: 16.00    Types: Cigarettes   Smokeless tobacco: Never  Vaping Use   Vaping Use: Former  Substance Use Topics   Alcohol use: Not Currently   Drug use: Not Currently     Allergies   Elavil [amitriptyline] and Tramadol   Review of Systems Review of Systems Per HPI  Physical Exam Triage Vital Signs ED Triage Vitals  Enc Vitals Group     BP 11/29/21 0851 128/88     Pulse Rate 11/29/21 0851 84     Resp 11/29/21 0851 18     Temp 11/29/21 0851 98.1 F (36.7 C)     Temp src --      SpO2 11/29/21 0851 96 %     Weight --      Height --      Head Circumference --      Peak Flow --      Pain Score 11/29/21 0847 10     Pain Loc --      Pain Edu? --      Excl. in [place]? --    No data found.  Updated Vital Signs BP 128/88   Pulse 84   Temp 98.1 F (36.7 C)   Resp 18   LMP 11/05/2021   SpO2 96%   Visual Acuity Right Eye Distance:   Left Eye Distance:   Bilateral Distance:    Right Eye Near:   Left Eye Near:    Bilateral Near:     Physical Exam Vitals and nursing note reviewed.  Constitutional:      General: She is not in acute distress.    Appearance: Normal appearance. She is well-developed.  Eyes:     Extraocular Movements: Extraocular movements intact.     Conjunctiva/sclera: Conjunctivae normal.     Pupils: Pupils are equal, round, and reactive to light.  Cardiovascular:     Rate and Rhythm: Normal rate and regular rhythm.     Heart sounds: Normal heart sounds.  Pulmonary:     Effort: Pulmonary effort is normal.     Breath sounds: Normal breath sounds.  Abdominal:     General: Bowel sounds are normal. There is no distension.     Palpations: Abdomen is soft.     Tenderness: There is no abdominal tenderness. There is no right CVA tenderness, left CVA tenderness,  guarding or rebound.  Genitourinary:    Vagina: Normal. No vaginal discharge.  Musculoskeletal:     Right hip: Tenderness (Tenderness noted to the  lateral aspect of the right hip) present. Decreased range of motion. Normal strength.     Right upper leg: Tenderness (Tenderness noted to the right lateral thigh) present. No swelling or deformity.  Skin:    General: Skin is warm and dry.     Findings: No erythema or rash.  Neurological:     Mental Status: She is alert and oriented to person, place, and time.     Cranial Nerves: No cranial nerve deficit.  Psychiatric:        Behavior: Behavior normal.      UC Treatments / Results  Labs (all labs ordered are listed, but only abnormal results are displayed) Labs Reviewed - No data to display  EKG   Radiology DG Femur Min 2 Views Right  Result Date: 11/29/2021 CLINICAL DATA:  Right leg pain, inability to ambulate EXAM: RIGHT FEMUR 2 VIEWS COMPARISON:  None Available. FINDINGS: Frontal and lateral views of the right femur are obtained. No acute fracture, subluxation, or dislocation. Visualized joint spaces of the right hip and knee are well preserved. Soft tissues are normal. IMPRESSION: 1. Unremarkable right femur. Electronically Signed   By: Sharlet Salina M.D.   On: 11/29/2021 09:48    Procedures Procedures (including critical care time)  Medications Ordered in UC Medications  ketorolac (TORADOL) 30 MG/ML injection 30 mg (has no administration in time range)  dexamethasone (DECADRON) injection 10 mg (has no administration in time range)    Initial Impression / Assessment and Plan / UC Course  I have reviewed the triage vital signs and the nursing notes.  Pertinent labs & imaging results that were available during my care of the patient were reviewed by me and considered in my medical decision making (see chart for details).  Patient presents for complaints of right thigh/hip pain.  Symptoms started over the past 4 days.  Patient cannot recall any specific injury or trauma.  On exam, patient has tenderness to the lateral aspect of the right thigh/hip.  There is no swelling,  obvious deformity, or ecchymosis present.  Patient reports that she has pain with weightbearing.  We will reinforce with patient that she should try to ambulate as there is no fracture.  Patient was given Decadron 10 mg IM and Toradol 30 mg IM injections in the clinic.  Patient was prescribed ibuprofen 800 mg for pain control.  Supportive care recommendations were provided to the patient to include the use of ice, heat, and gentle stretching.  Patient advised to follow-up with her primary care physician or with orthopedics if symptoms fail to improve.  Patient was given the information for [facility] and for [facility].  Patient advised to follow-up as needed. Final Clinical Impressions(s) / UC Diagnoses   Final diagnoses:  Right thigh pain  Right hip pain     Discharge Instructions      Your x-rays are negative for fracture or dislocation. Take medication as prescribed. Recommend the use of ice or heat as needed.  Apply ice for pain or swelling, heat for spasm or stiffness.  Apply for 20 minutes, remove for 1 hour, then repeat as needed. Try to begin weightbearing as soon as possible.  Weightbearing as tolerated initially then increase. If symptoms fail to improve, recommend following up with orthopedics.  You can follow-up with [facility] at [phone] or [facility] at [phone]. Follow-up as needed.     ED Prescriptions   None    PDMP not reviewed this encounter.   Abran Cantor, NP 11/29/21 1011

## 2021-11-29 NOTE — ED Triage Notes (Signed)
Pt presents with right leg pain and has difficulty ambulating , denies injury but states she thinks leg is broken . Pain is in upper right thigh

## 2021-11-29 NOTE — Discharge Instructions (Signed)
Your x-rays are negative for fracture or dislocation. Take medication as prescribed. Recommend the use of ice or heat as needed.  Apply ice for pain or swelling, heat for spasm or stiffness.  Apply for 20 minutes, remove for 1 hour, then repeat as needed. Try to begin weightbearing as soon as possible.  Weightbearing as tolerated initially then increase. If symptoms fail to improve, recommend following up with orthopedics.  You can follow-up with [facility] [place] [facility] at [phone] or [facility] at [phone]. Follow-up as needed.

## 2021-12-01 ENCOUNTER — Encounter: Payer: Self-pay | Admitting: Emergency Medicine

## 2021-12-01 ENCOUNTER — Ambulatory Visit
Admission: EM | Admit: 2021-12-01 | Discharge: 2021-12-01 | Disposition: A | Discharge status: Home / Self Care | Payer: Medicaid Other | Attending: Family Medicine | Admitting: Family Medicine

## 2021-12-01 DIAGNOSIS — M5431 Sciatica, right side: Secondary | ICD-10-CM | POA: Diagnosis not present

## 2021-12-01 MED ORDER — PREDNISONE 10 MG PO TABS: ORAL_TABLET | ORAL | 0 refills | Status: DC

## 2021-12-01 MED ORDER — CYCLOBENZAPRINE HCL 10 MG PO TABS: 10.0000 mg | ORAL_TABLET | Freq: Three times a day (TID) | ORAL | 0 refills | Status: DC | PRN

## 2021-12-01 MED ORDER — CYCLOBENZAPRINE HCL 10 MG PO TABS: ORAL_TABLET | ORAL | 0 refills | Status: DC

## 2021-12-01 NOTE — ED Triage Notes (Signed)
Right leg giving out x 5 days ago.  Pain on right side of thigh.  Was seen about 2 days ago for same and had x-ray completed.  States steroid shot helped the pain.

## 2021-12-05 NOTE — ED Provider Notes (Signed)
[facility]    CSN: [id number] Arrival date & time: 12/01/21  4098      History   Chief Complaint No chief complaint on file.   HPI Jill Mosley is a 36 y.o. female.   Presenting today following up on persisting right leg pain starting in buttock and extending down back and side of thigh. Was seen for the same 2 days ago, x-ray of the right femur without abnormality and given steroid injection. This did help some but still having significant pain, leg gives out with weight bearing per patient. Denies bowel or bladder incontinence, saddle anesthesia, fever, chills, swelling, discoloration, numbness. States OTC pain relievers not helping. No known injury.     Past Medical History:  Diagnosis Date   ADHD    Anxiety    Depression    Papanicolaou smear of cervix with positive high risk human papilloma virus (HPV) test 09/24/2020   09/24/20 repeat in 1 year per ASCCP guidelines, 5 year risk of CIN 3+ is 4.8%   PTSD (post-traumatic stress disorder)     Patient Active Problem List   Diagnosis Date Noted   Papanicolaou smear of cervix with positive high risk human papilloma virus (HPV) test 09/24/2020   Menorrhagia with regular cycle 09/16/2020   General counseling and advice for contraceptive management 09/16/2020   Encounter for gynecological examination with Papanicolaou smear of cervix 09/16/2020   Routine general medical examination at a health care facility 09/16/2020    Past Surgical History:  Procedure Laterality Date   APPENDECTOMY     CHOLECYSTECTOMY     WRIST FRACTURE SURGERY Right 2021    OB History     Gravida  3   Para  3   Term  3   Preterm      AB      Living  3      SAB      IAB      Ectopic      Multiple      Live Births  3            Home Medications    Prior to Admission medications   Medication Sig Start Date End Date Taking? Authorizing Provider  predniSONE (DELTASONE) 10 MG tablet Take 6 tabs day one, 5 tabs  day two, 4 tabs day three, etc 12/01/21  Yes Particia Nearing, PA-C  albuterol (VENTOLIN HFA) 108 (90 Base) MCG/ACT inhaler Inhale 2 puffs into the lungs every 6 (six) hours as needed for wheezing or shortness of breath. 05/13/21   Wallis Bamberg, PA-C  benzonatate (TESSALON) 100 MG capsule Take 1-2 capsules (100-200 mg total) by mouth 3 (three) times daily as needed for cough. 05/13/21   Wallis Bamberg, PA-C  cetirizine (ZYRTEC) 10 MG tablet Take 10 mg by mouth daily.    [provider]  cyclobenzaprine (FLEXERIL) 10 MG tablet Take 1 tablet (10 mg total) by mouth 3 (three) times daily as needed for muscle spasms. Do not drink alcohol or drive while taking this medication.  May cause drowsiness. 12/01/21   Particia Nearing, PA-C  diphenhydrAMINE (BENADRYL) 25 MG tablet Take 25 mg by mouth daily as needed for allergies.    [provider]  doxycycline (VIBRAMYCIN) 100 MG capsule Take 1 capsule (100 mg total) by mouth 2 (two) times daily. 09/06/20   Bing Neighbors, FNP  FLUoxetine (PROZAC) 20 MG capsule Take 20 mg by mouth every morning. 08/12/19   [provider]  lidocaine (  LIDODERM) 5 % Place 1 patch onto the skin daily. Remove & Discard patch within 12 hours or as directed by MD 12/02/20   Sponseller, Gypsy Balsam, PA-C  predniSONE (DELTASONE) 20 MG tablet Take 2 tablets (40 mg total) by mouth daily. 05/13/21   Jaynee Eagles, PA-C  promethazine-dextromethorphan (PROMETHAZINE-DM) 6.25-15 MG/5ML syrup Take 5 mLs by mouth at bedtime as needed for cough. 05/13/21   Jaynee Eagles, PA-C  tiZANidine (ZANAFLEX) 4 MG tablet Take 1 tablet (4 mg total) by mouth every 6 (six) hours as needed for muscle spasms. 11/03/20   Scot Jun, FNP    Family History Family History  Problem Relation Age of Onset   Cancer Maternal Grandmother    Heart attack Maternal Grandfather    Diabetes Mother    Hypertension Mother    Hyperlipidemia Mother    Hypertension Maternal Aunt    Thyroid disease  Cousin    ADD / ADHD Son    ODD Son    Other Daughter        lazy eye    Social History Social History   Tobacco Use   Smoking status: Every Day    Packs/day: 1.00    Years: 16.00    Total pack years: 16.00    Types: Cigarettes   Smokeless tobacco: Never  Vaping Use   Vaping Use: Former  Substance Use Topics   Alcohol use: Not Currently   Drug use: Not Currently     Allergies   Elavil [amitriptyline] and Tramadol   Review of Systems Review of Systems PER HPI  Physical Exam Triage Vital Signs ED Triage Vitals  Enc Vitals Group     BP 12/01/21 0824 (!) 160/85     Pulse Rate 12/01/21 0824 75     Resp 12/01/21 0824 18     Temp 12/01/21 0824 98.3 F (36.8 C)     Temp Source 12/01/21 0824 Oral     SpO2 12/01/21 0824 93 %     Weight --      Height --      Head Circumference --      Peak Flow --      Pain Score 12/01/21 0825 8     Pain Loc --      Pain Edu? --      Excl. in [place]? --    No data found.  Updated Vital Signs BP (!) 160/85 (BP Location: Right Arm)   Pulse 75   Temp 98.3 F (36.8 C) (Oral)   Resp 18   LMP 11/05/2021 (Exact Date)   SpO2 93%   Visual Acuity Right Eye Distance:   Left Eye Distance:   Bilateral Distance:    Right Eye Near:   Left Eye Near:    Bilateral Near:     Physical Exam Vitals and nursing note reviewed.  Constitutional:      Appearance: Normal appearance. She is not ill-appearing.  HENT:     Head: Atraumatic.  Eyes:     Extraocular Movements: Extraocular movements intact.     Conjunctiva/sclera: Conjunctivae normal.  Cardiovascular:     Rate and Rhythm: Normal rate and regular rhythm.     Heart sounds: Normal heart sounds.  Pulmonary:     Effort: Pulmonary effort is normal.     Breath sounds: Normal breath sounds.  Musculoskeletal:        General: Tenderness present. No swelling. Normal range of motion.     Cervical back: Normal range of motion and  neck supple.     Comments: Neg SLR b/l LEs. No midline  spinal ttp diffusely. Normal gait and ROM  Skin:    General: Skin is warm and dry.  Neurological:     Mental Status: She is alert and oriented to person, place, and time.     Sensory: No sensory deficit.     Motor: No weakness.     Gait: Gait normal.  Psychiatric:        Mood and Affect: Mood normal.        Thought Content: Thought content normal.        Judgment: Judgment normal.      UC Treatments / Results  Labs (all labs ordered are listed, but only abnormal results are displayed) Labs Reviewed - No data to display  EKG   Radiology No results found.  Procedures Procedures (including critical care time)  Medications Ordered in UC Medications - No data to display  Initial Impression / Assessment and Plan / UC Course  I have reviewed the triage vital signs and the nursing notes.  Pertinent labs & imaging results that were available during my care of the patient were reviewed by me and considered in my medical decision making (see chart for details).     Improving on IM steroid injection, but still symptomatic so will add flexeril, prednisone taper and stretches, heat, rest. Return for worsening sxs. No red flag findings today.  Final Clinical Impressions(s) / UC Diagnoses   Final diagnoses:  Right sided sciatica   Discharge Instructions   None    ED Prescriptions     Medication Sig Dispense Auth. Provider   predniSONE (DELTASONE) 10 MG tablet Take 6 tabs day one, 5 tabs day two, 4 tabs day three, etc 21 tablet Particia Nearing, PA-C   cyclobenzaprine (FLEXERIL) 10 MG tablet  (Status: Discontinued) Do not drink alcohol or drive while taking this medication.  May cause drowsiness. 15 tablet Particia Nearing, [place]   cyclobenzaprine (FLEXERIL) 10 MG tablet Take 1 tablet (10 mg total) by mouth 3 (three) times daily as needed for muscle spasms. Do not drink alcohol or drive while taking this medication.  May cause drowsiness. 15 tablet Particia Nearing, [place]      PDMP not reviewed this encounter.   Particia Nearing, [place] 12/05/21 2049

## 2021-12-22 ENCOUNTER — Ambulatory Visit: Payer: Medicaid Other

## 2022-02-22 ENCOUNTER — Ambulatory Visit: Payer: Medicaid Other | Admitting: Internal Medicine

## 2022-02-28 ENCOUNTER — Ambulatory Visit: Payer: Medicaid Other | Admitting: Adult Health

## 2022-02-28 ENCOUNTER — Ambulatory Visit: Payer: Medicaid Other | Admitting: Internal Medicine

## 2022-03-07 ENCOUNTER — Ambulatory Visit: Payer: Medicaid Other | Admitting: Internal Medicine

## 2022-03-08 ENCOUNTER — Ambulatory Visit: Payer: Medicaid Other | Admitting: Internal Medicine

## 2022-03-14 ENCOUNTER — Encounter (HOSPITAL_COMMUNITY): Payer: Self-pay | Admitting: *Deleted

## 2022-03-14 ENCOUNTER — Other Ambulatory Visit: Payer: Self-pay

## 2022-03-14 ENCOUNTER — Emergency Department (HOSPITAL_COMMUNITY)
Admission: EM | Admit: 2022-03-14 | Discharge: 2022-03-14 | Disposition: A | Discharge status: Home / Self Care | Payer: Medicaid Other | Attending: Emergency Medicine | Admitting: Emergency Medicine

## 2022-03-14 DIAGNOSIS — Z1152 Encounter for screening for COVID-19: Secondary | ICD-10-CM | POA: Insufficient documentation

## 2022-03-14 DIAGNOSIS — K529 Noninfective gastroenteritis and colitis, unspecified: Secondary | ICD-10-CM | POA: Diagnosis not present

## 2022-03-14 DIAGNOSIS — R1110 Vomiting, unspecified: Secondary | ICD-10-CM | POA: Diagnosis present

## 2022-03-14 LAB — COMPREHENSIVE METABOLIC PANEL
ALT: 19 U/L (ref 0–44)
AST: 32 U/L (ref 15–41)
Albumin: 3.8 g/dL (ref 3.5–5.0)
Alkaline Phosphatase: 59 U/L (ref 38–126)
Anion gap: 6 (ref 5–15)
BUN: 8 mg/dL (ref 6–20)
CO2: 19 mmol/L — ABNORMAL LOW (ref 22–32)
Calcium: 8.4 mg/dL — ABNORMAL LOW (ref 8.9–10.3)
Chloride: 111 mmol/L (ref 98–111)
Creatinine, Ser: 0.71 mg/dL (ref 0.44–1.00)
GFR, Estimated: >60 mL/min (ref >60)
Glucose, Bld: 109 mg/dL — ABNORMAL HIGH (ref 70–99)
Potassium: 4.5 mmol/L (ref 3.5–5.1)
Sodium: 136 mmol/L (ref 135–145)
Total Bilirubin: 0.9 mg/dL (ref 0.3–1.2)
Total Protein: 6.8 g/dL (ref 6.5–8.1)

## 2022-03-14 LAB — CBC WITH DIFFERENTIAL/PLATELET
Abs Immature Granulocytes: 0.02 10*3/uL (ref 0.00–0.07)
Basophils Absolute: 0 10*3/uL (ref 0.0–0.1)
Basophils Relative: 0 %
Eosinophils Absolute: 0.1 10*3/uL (ref 0.0–0.5)
Eosinophils Relative: 1 %
HCT: 45.1 % (ref 36.0–46.0)
Hemoglobin: 16.1 g/dL — ABNORMAL HIGH (ref 12.0–15.0)
Immature Granulocytes: 0 %
Lymphocytes Relative: 28 %
Lymphs Abs: 2.4 10*3/uL (ref 0.7–4.0)
MCH: 30.8 pg (ref 26.0–34.0)
MCHC: 35.7 g/dL (ref 30.0–36.0)
MCV: 86.2 fL (ref 80.0–100.0)
Monocytes Absolute: 0.4 10*3/uL (ref 0.1–1.0)
Monocytes Relative: 5 %
Neutro Abs: 5.7 10*3/uL (ref 1.7–7.7)
Neutrophils Relative %: 66 %
Platelets: 188 10*3/uL (ref 150–400)
RBC: 5.23 MIL/uL — ABNORMAL HIGH (ref 3.87–5.11)
RDW: 13.1 % (ref 11.5–15.5)
WBC: 8.7 10*3/uL (ref 4.0–10.5)
nRBC: 0 % (ref 0.0–0.2)

## 2022-03-14 LAB — URINALYSIS, ROUTINE W REFLEX MICROSCOPIC
Bilirubin Urine: NEGATIVE
Glucose, UA: NEGATIVE mg/dL
Ketones, ur: NEGATIVE mg/dL
Leukocytes,Ua: NEGATIVE
Nitrite: NEGATIVE
Protein, ur: NEGATIVE mg/dL
Specific Gravity, Urine: 1.017 (ref 1.005–1.030)
pH: 6 (ref 5.0–8.0)

## 2022-03-14 LAB — RESP PANEL BY RT-PCR (FLU A&B, COVID) ARPGX2
Influenza A by PCR: NEGATIVE
Influenza B by PCR: NEGATIVE
SARS Coronavirus 2 by RT PCR: NEGATIVE

## 2022-03-14 LAB — PREGNANCY, URINE: Preg Test, Ur: NEGATIVE

## 2022-03-14 LAB — LIPASE, BLOOD: Lipase: 31 U/L (ref 11–51)

## 2022-03-14 MED ORDER — PANTOPRAZOLE SODIUM 40 MG PO TBEC: 40.0000 mg | DELAYED_RELEASE_TABLET | Freq: Every day | ORAL | 0 refills | Status: DC

## 2022-03-14 MED ORDER — FAMOTIDINE IN NACL 20-0.9 MG/50ML-% IV SOLN
20.0000 mg | Freq: Once | INTRAVENOUS | Status: AC
Start: 2022-03-14 — End: 2022-03-14
  Administered 2022-03-14: 20 mg via INTRAVENOUS
  Filled 2022-03-14: qty 50

## 2022-03-14 MED ORDER — METOCLOPRAMIDE HCL 5 MG/ML IJ SOLN
10.0000 mg | Freq: Once | INTRAMUSCULAR | Status: AC
Start: 2022-03-14 — End: 2022-03-14
  Administered 2022-03-14: 10 mg via INTRAVENOUS
  Filled 2022-03-14: qty 2

## 2022-03-14 MED ORDER — ONDANSETRON 4 MG PO TBDP: 4.0000 mg | ORAL_TABLET | Freq: Three times a day (TID) | ORAL | 0 refills | Status: DC | PRN

## 2022-03-14 MED ORDER — SODIUM CHLORIDE 0.9 % IV BOLUS
1000.0000 mL | Freq: Once | INTRAVENOUS | Status: AC
  Administered 2022-03-14: 1000 mL via INTRAVENOUS

## 2022-03-14 MED ORDER — DIPHENHYDRAMINE HCL 50 MG/ML IJ SOLN
25.0000 mg | Freq: Once | INTRAMUSCULAR | Status: AC
  Administered 2022-03-14: 25 mg via INTRAVENOUS

## 2022-03-14 MED ORDER — DIPHENHYDRAMINE HCL 50 MG/ML IJ SOLN
25.0000 mg | Freq: Once | INTRAMUSCULAR | Status: AC
Start: 2022-03-14 — End: 2022-03-14
  Administered 2022-03-14: 25 mg via INTRAVENOUS

## 2022-03-14 NOTE — ED Triage Notes (Signed)
Pt c/o vomiting that started this am; pt states her BP has been running high lately but no hx

## 2022-03-14 NOTE — ED Notes (Signed)
Pt called nursing staff to room due to anxiety. Pt had removed IV and all monitoring equipment. PA Tammy T. Notified, orders for IV benadryl. See MAR.

## 2022-03-14 NOTE — ED Notes (Signed)
Pt removed second IV. Crying in bed, unable to console. Pt requesting no more IV's at this time. PA notified.

## 2022-03-14 NOTE — ED Notes (Signed)
Pt with continued anxiety, asking to leave AMA. PA called to bedside. See [facility]

## 2022-03-14 NOTE — Discharge Instructions (Signed)
Take the pantoprazole daily before breakfast.  Take the ODT Zofran as needed for nausea vomiting.  Try to avoid spicy greasy foods or alcohol.  Follow-up with your primary care provider for recheck or return to the emergency department for any new or worsening symptoms.

## 2022-03-17 NOTE — ED Provider Notes (Signed)
[facility] EMERGENCY DEPARTMENT Provider Note   CSN: [id number] Arrival date & time: 03/14/22  3893     History  Chief Complaint  Patient presents with   Emesis    Jill Mosley is a 36 y.o. female.   Emesis Associated symptoms: no abdominal pain, no arthralgias, no diarrhea, no fever, no myalgias and no sore throat        Jill Mosley is a 36 y.o. female who presents to the Emergency Department complaining of elevated blood pressure and vomiting.  States her vomiting has been intermittent for some time but worse this morning upon waking.  She also complains of a burning pain at times in her chest.  She denies any generalized bodyaches, shortness of breath, fever or chills, and abdominal pain.  No dysuria.  Nothing makes her symptoms better or worse.  Home Medications Prior to Admission medications   Medication Sig Start Date End Date Taking? Authorizing Provider  albuterol (VENTOLIN HFA) 108 (90 Base) MCG/ACT inhaler Inhale 2 puffs into the lungs every 6 (six) hours as needed for wheezing or shortness of breath. 05/13/21  Yes Wallis Bamberg, PA-C  buprenorphine-naloxone (SUBOXONE) 8-2 mg SUBL SL tablet Place 0.5 tablets under the tongue 4 (four) times daily. 03/04/22  Yes [provider]  diphenhydrAMINE (BENADRYL) 25 MG tablet Take 25 mg by mouth daily as needed for allergies.   Yes [provider]  gabapentin (NEURONTIN) 100 MG capsule Take 100 mg by mouth 3 (three) times daily. 03/10/22  Yes [provider]  KRISTALOSE 10 g packet Take 10 g by mouth daily. 03/10/22  Yes [provider]  ondansetron (ZOFRAN-ODT) 4 MG disintegrating tablet Take 1 tablet (4 mg total) by mouth every 8 (eight) hours as needed for nausea or vomiting. 03/14/22  Yes Tiara Bartoli, PA-C  pantoprazole (PROTONIX) 40 MG tablet Take 1 tablet (40 mg total) by mouth daily. 03/14/22  Yes Annalaya Wile, PA-C  cetirizine (ZYRTEC) 10 MG tablet Take 10 mg by mouth daily. Patient  not taking: Reported on 03/14/2022    [provider]  lidocaine (LIDODERM) 5 % Place 1 patch onto the skin daily. Remove & Discard patch within 12 hours or as directed by MD Patient not taking: Reported on 03/14/2022 12/02/20   Sponseller, Lupe Carney R, PA-C      Allergies    Elavil [amitriptyline], Reglan [metoclopramide], and Tramadol    Review of Systems   Review of Systems  Constitutional:  Negative for appetite change and fever.  HENT:  Negative for congestion, sore throat and trouble swallowing.   Respiratory:  Negative for chest tightness and shortness of breath.   Cardiovascular:  Positive for chest pain.  Gastrointestinal:  Positive for nausea and vomiting. Negative for abdominal pain and diarrhea.  Genitourinary:  Negative for difficulty urinating and dysuria.  Musculoskeletal:  Negative for arthralgias and myalgias.  Skin:  Negative for rash.  Neurological:  Negative for weakness and numbness.    Physical Exam Updated Vital Signs BP (!) 167/101 (BP Location: Right Arm)   Pulse 64   Temp 98.1 F (36.7 C) (Oral)   Resp 20   Ht 5\' 9"  (1.753 m)   Wt 104.3 kg   LMP 02/21/2022 (Approximate)   SpO2 98%   BMI 33.97 kg/m  Physical Exam Vitals and nursing note reviewed.  Constitutional:      General: She is not in acute distress.    Appearance: Normal appearance. She is not ill-appearing or toxic-appearing.  HENT:  Mouth/Throat:     Mouth: Mucous membranes are moist.     Pharynx: Oropharynx is clear.  Cardiovascular:     Rate and Rhythm: Normal rate and regular rhythm.     Pulses: Normal pulses.  Pulmonary:     Effort: Pulmonary effort is normal. No respiratory distress.     Breath sounds: Normal breath sounds.  Abdominal:     Palpations: Abdomen is soft.     Tenderness: There is no abdominal tenderness.  Musculoskeletal:        General: Normal range of motion.  Skin:    General: Skin is warm.     Findings: No rash.  Neurological:     General: No  focal deficit present.     Mental Status: She is alert.     Sensory: No sensory deficit.     Motor: No weakness.     ED Results / Procedures / Treatments   Labs (all labs ordered are listed, but only abnormal results are displayed) Labs Reviewed  CBC WITH DIFFERENTIAL/PLATELET - Abnormal; Notable for the following components:      Result Value   RBC 5.23 (*)    Hemoglobin 16.1 (*)    All other components within normal limits  COMPREHENSIVE METABOLIC PANEL - Abnormal; Notable for the following components:   CO2 19 (*)    Glucose, Bld 109 (*)    Calcium 8.4 (*)    All other components within normal limits  URINALYSIS, ROUTINE W REFLEX MICROSCOPIC - Abnormal; Notable for the following components:   Hgb urine dipstick SMALL (*)    Bacteria, UA RARE (*)    All other components within normal limits  RESP PANEL BY RT-PCR (FLU A&B, COVID) ARPGX2  LIPASE, BLOOD  PREGNANCY, URINE    EKG EKG Interpretation  Date/Time:  Monday March 14 2022 10:08:48 EST Ventricular Rate:  57 PR Interval:  144 QRS Duration: 83 QT Interval:  428 QTC Calculation: 417 R Axis:   75 Text Interpretation: Sinus rhythm No old tracing to compare Confirmed by Rolan Bucco [phone]) on 03/15/2022 12:14:34 PM  Radiology No results found.  Procedures Procedures    Medications Ordered in ED Medications  sodium chloride 0.9 % bolus 1,000 mL (0 mLs Intravenous Stopped 03/14/22 1100)  metoCLOPramide (REGLAN) injection 10 mg (10 mg Intravenous Given 03/14/22 1009)  famotidine (PEPCID) IVPB 20 mg premix (0 mg Intravenous Stopped 03/14/22 1100)  diphenhydrAMINE (BENADRYL) injection 25 mg (25 mg Intravenous Given 03/14/22 1027)  diphenhydrAMINE (BENADRYL) injection 25 mg (25 mg Intravenous Given 03/14/22 1043)    ED Course/ Medical Decision Making/ A&P                           Medical Decision Making Patient here for evaluation of vomiting that has been present for some time, worse this morning upon  waking.  She is also concerned about elevated blood pressure.  Denies history of hypertension.  She denies any fever, recent illness, new medications or abdominal pain.  Has burning pain of her chest at times.  On exam, patient slightly anxious appearing.  Hypertensive on arrival.  Abdomen soft and nontender.  No active vomiting.  Differential would include but not limited to viral process, GERD, peptic ulcer, ACS although this is felt less likely as chest pain is intermittent and burning in quality.  Acute abdominal process felt less likely given reassuring abdominal exam.  Amount and/or Complexity of Data Reviewed Labs: ordered.  Details: Labs interpreted by me, no evidence of leukocytosis, chemistries without derangement, lipase unremarkable, pregnancy test negative, urinalysis without evidence of infection.  Respiratory panel negative for COVID and influenza. ECG/medicine tests: ordered.    Details: EKG shows sinus rhythm Discussion of management or test interpretation with external provider(s): Patient here with reports of vomiting, no vomiting during ER stay.  She was given IV fluids, famotidine and metoclopramide.  I was called into the room by nursing staff after patient received metoclopramide stating that she was crying and very anxious and had pulled out her IV.  Likely a dystonic reaction, IV Benadryl was ordered.  Patient reassessed.  Symptoms continued, second dose of Benadryl given, patient requesting to leave AMA.    I have reevaluated her and discussed cause of her reaction.  She again removed her second IV.  Request no additional IVs started.  Appears slightly more calm.  Still requesting to leave.  Remains somewhat hypertensive but still anxious.  Doubt emergent process.  Symptoms likely related to GERD.  Patient appropriate for discharge home, will give resource information for GI follow-up and information so patient may establish primary care.  Return precautions were  discussed.  Risk Prescription drug management.           Final Clinical Impression(s) / ED Diagnoses Final diagnoses:  Gastroenteritis    Rx / DC Orders ED Discharge Orders          Ordered    pantoprazole (PROTONIX) 40 MG tablet  Daily        03/14/22 1244    ondansetron (ZOFRAN-ODT) 4 MG disintegrating tablet  Every 8 hours PRN        03/14/22 1244              Pauline Aus, PA-C 03/17/22 1538    Bethann Berkshire, MD 03/19/22 1132

## 2022-03-21 ENCOUNTER — Ambulatory Visit
Admission: EM | Admit: 2022-03-21 | Discharge: 2022-03-21 | Disposition: A | Discharge status: Home / Self Care | Payer: Medicaid Other

## 2022-03-21 DIAGNOSIS — I10 Essential (primary) hypertension: Secondary | ICD-10-CM | POA: Diagnosis not present

## 2022-03-21 NOTE — ED Triage Notes (Signed)
Patient wasn't her BP checked, was elevated this morning at her doctors 196/132, 196/109 and gave medication for BP today at 10am. BP is 149/94 now.

## 2022-03-21 NOTE — ED Provider Notes (Signed)
[facility]    CSN: [id number] Arrival date & time: 03/21/22  1442      History   Chief Complaint Chief Complaint  Patient presents with   Hypertension    HPI Jill Mosley is a 35 y.o. female.   Patient presents today with concerns regarding blood pressure.  Reports this morning, she was at another doctor's appointment and her blood pressure was significantly elevated.  She was started on clonidine and is requesting blood pressure recheck today.  Reports she woke up with a headache this morning, however no vision changes, lower extremity swelling, chest pain, shortness of breath.  No dizziness or lightheadedness.  No blurred vision.  Reports that she got a prescription for blood pressure medication and plans to take it tonight as prescribed.    Past Medical History:  Diagnosis Date   ADHD    Anxiety    Depression    Papanicolaou smear of cervix with positive high risk human papilloma virus (HPV) test 09/24/2020   09/24/20 repeat in 1 year per ASCCP guidelines, 5 year risk of CIN 3+ is 4.8%   PTSD (post-traumatic stress disorder)     Patient Active Problem List   Diagnosis Date Noted   Papanicolaou smear of cervix with positive high risk human papilloma virus (HPV) test 09/24/2020   Menorrhagia with regular cycle 09/16/2020   General counseling and advice for contraceptive management 09/16/2020   Encounter for gynecological examination with Papanicolaou smear of cervix 09/16/2020   Routine general medical examination at a health care facility 09/16/2020    Past Surgical History:  Procedure Laterality Date   APPENDECTOMY     CHOLECYSTECTOMY     WRIST FRACTURE SURGERY Right 2021    OB History     Gravida  3   Para  3   Term  3   Preterm      AB      Living  3      SAB      IAB      Ectopic      Multiple      Live Births  3            Home Medications    Prior to Admission medications   Medication Sig Start Date End Date  Taking? Authorizing Provider  albuterol (VENTOLIN HFA) 108 (90 Base) MCG/ACT inhaler Inhale 2 puffs into the lungs every 6 (six) hours as needed for wheezing or shortness of breath. 05/13/21  Yes Wallis Bamberg, PA-C  buprenorphine-naloxone (SUBOXONE) 8-2 mg SUBL SL tablet Place 0.5 tablets under the tongue 4 (four) times daily. 03/04/22  Yes [provider]  cetirizine (ZYRTEC) 10 MG tablet Take 10 mg by mouth daily.   Yes [provider]  diphenhydrAMINE (BENADRYL) 25 MG tablet Take 25 mg by mouth daily as needed for allergies.   Yes [provider]  gabapentin (NEURONTIN) 100 MG capsule Take 100 mg by mouth 3 (three) times daily. 03/10/22  Yes [provider]  KRISTALOSE 10 g packet Take 10 g by mouth daily. 03/10/22  Yes [provider]  ondansetron (ZOFRAN-ODT) 4 MG disintegrating tablet Take 1 tablet (4 mg total) by mouth every 8 (eight) hours as needed for nausea or vomiting. 03/14/22  Yes Triplett, Tammy, PA-C  pantoprazole (PROTONIX) 40 MG tablet Take 1 tablet (40 mg total) by mouth daily. 03/14/22  Yes Triplett, Tammy, PA-C  lidocaine (LIDODERM) 5 % Place 1 patch onto the skin daily. Remove & Discard patch  within 12 hours or as directed by MD Patient not taking: Reported on 03/14/2022 12/02/20   Sponseller, Eugene Gavia, PA-C    Family History Family History  Problem Relation Age of Onset   Cancer Maternal Grandmother    Heart attack Maternal Grandfather    Diabetes Mother    Hypertension Mother    Hyperlipidemia Mother    Hypertension Maternal Aunt    Thyroid disease Cousin    ADD / ADHD Son    ODD Son    Other Daughter        lazy eye    Social History Social History   Tobacco Use   Smoking status: Every Day    Packs/day: 1.00    Years: 16.00    Total pack years: 16.00    Types: Cigarettes   Smokeless tobacco: Never  Vaping Use   Vaping Use: Former  Substance Use Topics   Alcohol use: Not Currently   Drug use: Not Currently      Allergies   Elavil [amitriptyline], Reglan [metoclopramide], and Tramadol   Review of Systems Review of Systems Per HPI  Physical Exam Triage Vital Signs ED Triage Vitals [03/21/22 1641]  Enc Vitals Group     BP (!) 149/94     Pulse Rate 62     Resp 18     Temp 98 F (36.7 C)     Temp Source Oral     SpO2 95 %     Weight      Height      Head Circumference      Peak Flow      Pain Score 0     Pain Loc      Pain Edu?      Excl. in GC?    No data found.  Updated Vital Signs BP (!) 149/94 (BP Location: Right Arm)   Pulse 62   Temp 98 F (36.7 C) (Oral)   Resp 18   LMP 03/15/2022 (Approximate)   SpO2 95%   Visual Acuity Right Eye Distance:   Left Eye Distance:   Bilateral Distance:    Right Eye Near:   Left Eye Near:    Bilateral Near:     Physical Exam Vitals and nursing note reviewed.  Constitutional:      General: She is not in acute distress.    Appearance: Normal appearance. She is obese. She is not toxic-appearing.  HENT:     Head: Normocephalic and atraumatic.     Mouth/Throat:     Mouth: Mucous membranes are moist.     Pharynx: Oropharynx is clear.  Eyes:     General: No scleral icterus.    Extraocular Movements: Extraocular movements intact.  Cardiovascular:     Rate and Rhythm: Normal rate and regular rhythm.  Pulmonary:     Effort: Pulmonary effort is normal. No respiratory distress.  Skin:    General: Skin is warm and dry.     Capillary Refill: Capillary refill takes less than 2 seconds.     Coloration: Skin is not jaundiced or pale.     Findings: No erythema.  Neurological:     Mental Status: She is alert and oriented to person, place, and time.  Psychiatric:        Behavior: Behavior is cooperative.      UC Treatments / Results  Labs (all labs ordered are listed, but only abnormal results are displayed) Labs Reviewed - No data to display  EKG   Radiology  No results found.  Procedures Procedures (including  critical care time)  Medications Ordered in UC Medications - No data to display  Initial Impression / Assessment and Plan / UC Course  I have reviewed the triage vital signs and the nursing notes.  Pertinent labs & imaging results that were available during my care of the patient were reviewed by me and considered in my medical decision making (see chart for details).   Patient is well-appearing, normotensive, afebrile, not tachycardic, not tachypneic, oxygenating well on room air.    Essential hypertension Blood pressure today is stable Continue clonidine Follow-up with primary care provider  The patient was given the opportunity to ask questions.  All questions answered to their satisfaction.  The patient is in agreement to this plan.    Final Clinical Impressions(s) / UC Diagnoses   Final diagnoses:  Essential hypertension     Discharge Instructions      Your blood pressure today is much improved from earlier.  Please take the blood pressure medication as prescribed by her primary care provider.  Follow-up with them for blood pressure recheck.     ED Prescriptions   None    PDMP not reviewed this encounter.   Valentino Nose, NP 03/21/22 [phone]

## 2022-03-21 NOTE — Discharge Instructions (Signed)
Your blood pressure today is much improved from earlier.  Please take the blood pressure medication as prescribed by her primary care provider.  Follow-up with them for blood pressure recheck.

## 2022-04-14 ENCOUNTER — Ambulatory Visit
Admission: EM | Admit: 2022-04-14 | Discharge: 2022-04-14 | Disposition: A | Discharge status: Home / Self Care | Payer: Medicaid Other | Attending: Nurse Practitioner | Admitting: Nurse Practitioner

## 2022-04-14 DIAGNOSIS — I10 Essential (primary) hypertension: Secondary | ICD-10-CM | POA: Diagnosis not present

## 2022-04-14 DIAGNOSIS — M545 Low back pain, unspecified: Secondary | ICD-10-CM

## 2022-04-14 LAB — POCT URINALYSIS DIP (MANUAL ENTRY)
Bilirubin, UA: NEGATIVE
Glucose, UA: NEGATIVE mg/dL
Leukocytes, UA: NEGATIVE
Nitrite, UA: NEGATIVE
Protein Ur, POC: 30 mg/dL — AB
Spec Grav, UA: 1.02 (ref 1.010–1.025)
Urobilinogen, UA: 1 E.U./dL
pH, UA: 7.5 (ref 5.0–8.0)

## 2022-04-14 MED ORDER — AMLODIPINE BESYLATE 5 MG PO TABS: 5.0000 mg | ORAL_TABLET | Freq: Every day | ORAL | 0 refills | Status: DC

## 2022-04-14 MED ORDER — TIZANIDINE HCL 4 MG PO TABS: 4.0000 mg | ORAL_TABLET | Freq: Three times a day (TID) | ORAL | 0 refills | Status: DC | PRN

## 2022-04-14 NOTE — ED Notes (Signed)
Pt wants her urine to be check.

## 2022-04-14 NOTE — ED Triage Notes (Signed)
Pt reports her blood pressure is being high since September 2023. Pt reports she is on Subonex and her Dr told her she needs to find ut why her blood pressure is high.

## 2022-04-14 NOTE — Discharge Instructions (Addendum)
Blood pressure today is 128/86.  Since you have had elevated readings in other settings, I recommend rechecking at home; a good goal be less than 130/80.  Please establish care with a primary care provider for further evaluation and management.  You can start the amlodipine if you feel your blood pressure is running higher than 130/80.  Start the tizanidine to help with the pain in your back.  UA today is negative for a UTI today.

## 2022-04-14 NOTE — ED Provider Notes (Signed)
[facility]    CSN: [id number] Arrival date & time: 04/14/22  1106      History   Chief Complaint Chief Complaint  Patient presents with   Medication Refill    HPI Jill Mosley is a 37 y.o. female.   Patient presents today for multiple concerns.  First, she is concerned about her blood pressure.  Reports she was at her Suboxone doctor's office this morning and has a list of blood pressure readings with her.  They run from systolic 671I to 458K over diastolic 99I to 338S.  Reports her Suboxone doctor told her she was going to have a heart attack or stroke and she needed to come have it evaluated so we can tell her what is going on.  Initially, blood pressure is elevated, however upon my recheck, blood pressure is 128/86 today.  Patient denies chest pain, shortness of breath, vision changes, lightheadedness/dizziness, and headache today.  No lower extremity swelling.  She does endorse some musculoskeletal pain, specifically at her left elbow that shoots up her arm and her low back.  She endorses chronic recurrent low back pain coming from her sciatic nerve.  Reports she recently started a new job and feels stress from that.  She also smokes cigarettes as this is what relieves her stress per her report.  She recently went on a long car to [place] which she thinks exacerbated her back pain.  She is unable to take narcotics secondary to allergic reaction.  Reports she has been taking Tylenol without much benefit.  Has also taken NSAIDs without much benefit.  Reports she wants to be checked for UTI today.  She denies dysuria, urinary frequency or urgency, new urinary incontinence, foul urinary odor, or hematuria.  Reports the back pain is in her low back, not over her kidneys.  No fevers, nausea/vomiting, or vaginal discharge.    Past Medical History:  Diagnosis Date   ADHD    Anxiety    Depression    Papanicolaou smear of cervix with positive high risk human papilloma  virus (HPV) test 09/24/2020   09/24/20 repeat in 1 year per ASCCP guidelines, 5 year risk of CIN 3+ is 4.8%   PTSD (post-traumatic stress disorder)     Patient Active Problem List   Diagnosis Date Noted   Papanicolaou smear of cervix with positive high risk human papilloma virus (HPV) test 09/24/2020   Menorrhagia with regular cycle 09/16/2020   General counseling and advice for contraceptive management 09/16/2020   Encounter for gynecological examination with Papanicolaou smear of cervix 09/16/2020   Routine general medical examination at a health care facility 09/16/2020    Past Surgical History:  Procedure Laterality Date   APPENDECTOMY     CHOLECYSTECTOMY     WRIST FRACTURE SURGERY Right 2021    OB History     Gravida  3   Para  3   Term  3   Preterm      AB      Living  3      SAB      IAB      Ectopic      Multiple      Live Births  3            Home Medications    Prior to Admission medications   Medication Sig Start Date End Date Taking? Authorizing Provider  amLODipine (NORVASC) 5 MG tablet Take 1 tablet (5 mg total) by mouth daily. 04/14/22  Yes  Eulogio Bear, NP  tiZANidine (ZANAFLEX) 4 MG tablet Take 1 tablet (4 mg total) by mouth every 8 (eight) hours as needed for muscle spasms. Do not take with alcohol or while driving or operating heavy machinery.  May cause drowsiness. 04/14/22  Yes Eulogio Bear, NP  albuterol (VENTOLIN HFA) 108 (90 Base) MCG/ACT inhaler Inhale 2 puffs into the lungs every 6 (six) hours as needed for wheezing or shortness of breath. 05/13/21   Jaynee Eagles, PA-C  buprenorphine-naloxone (SUBOXONE) 8-2 mg SUBL SL tablet Place 0.5 tablets under the tongue 4 (four) times daily. 03/04/22   [provider]  cetirizine (ZYRTEC) 10 MG tablet Take 10 mg by mouth daily.    [provider]  diphenhydrAMINE (BENADRYL) 25 MG tablet Take 25 mg by mouth daily as needed for allergies.    [provider]   gabapentin (NEURONTIN) 100 MG capsule Take 100 mg by mouth 3 (three) times daily. 03/10/22   [provider]  KRISTALOSE 10 g packet Take 10 g by mouth daily. 03/10/22   [provider]  lidocaine (LIDODERM) 5 % Place 1 patch onto the skin daily. Remove & Discard patch within 12 hours or as directed by MD Patient not taking: Reported on 03/14/2022 12/02/20   Sponseller, Eugene Garnet R, PA-C  ondansetron (ZOFRAN-ODT) 4 MG disintegrating tablet Take 1 tablet (4 mg total) by mouth every 8 (eight) hours as needed for nausea or vomiting. 03/14/22   Triplett, Tammy, PA-C  pantoprazole (PROTONIX) 40 MG tablet Take 1 tablet (40 mg total) by mouth daily. 03/14/22   Kem Parkinson, PA-C    Family History Family History  Problem Relation Age of Onset   Cancer Maternal Grandmother    Heart attack Maternal Grandfather    Diabetes Mother    Hypertension Mother    Hyperlipidemia Mother    Hypertension Maternal Aunt    Thyroid disease Cousin    ADD / ADHD Son    ODD Son    Other Daughter        lazy eye    Social History Social History   Tobacco Use   Smoking status: Every Day    Packs/day: 1.00    Years: 16.00    Total pack years: 16.00    Types: Cigarettes   Smokeless tobacco: Never  Vaping Use   Vaping Use: Former  Substance Use Topics   Alcohol use: Not Currently   Drug use: Not Currently     Allergies   Elavil [amitriptyline], Reglan [metoclopramide], and Tramadol   Review of Systems Review of Systems Per HPI  Physical Exam Triage Vital Signs ED Triage Vitals  Enc Vitals Group     BP 04/14/22 1111 (!) 181/104     Pulse Rate 04/14/22 1111 78     Resp 04/14/22 1111 18     Temp 04/14/22 1111 99.8 F (37.7 C)     Temp Source 04/14/22 1111 Oral     SpO2 04/14/22 1111 96 %     Weight --      Height --      Head Circumference --      Peak Flow --      Pain Score 04/14/22 1114 0     Pain Loc --      Pain Edu? --      Excl. in [place]? --    No data  found.  Updated Vital Signs BP 128/86 (BP Location: Right Arm)   Pulse 78   Temp 99.8  F (37.7 C) (Oral)   Resp 18   LMP 04/14/2022 (Exact Date)   SpO2 96%   Visual Acuity Right Eye Distance:   Left Eye Distance:   Bilateral Distance:    Right Eye Near:   Left Eye Near:    Bilateral Near:     Physical Exam Vitals and nursing note reviewed.  Constitutional:      General: She is not in acute distress.    Appearance: Normal appearance. She is not toxic-appearing.  HENT:     Head: Normocephalic and atraumatic.     Mouth/Throat:     Mouth: Mucous membranes are moist.     Pharynx: Oropharynx is clear.  Eyes:     General: No scleral icterus.    Extraocular Movements: Extraocular movements intact.  Cardiovascular:     Rate and Rhythm: Normal rate and regular rhythm.  Pulmonary:     Effort: Pulmonary effort is normal. No respiratory distress.     Breath sounds: No wheezing, rhonchi or rales.  Abdominal:     General: Abdomen is flat.     Palpations: Abdomen is soft.     Tenderness: There is no right CVA tenderness or left CVA tenderness.  Musculoskeletal:     Cervical back: Normal range of motion.       Back:     Comments: Pain in area as diagrammed.  No bruising, redness, swelling, obvious deformity.    Lymphadenopathy:     Cervical: No cervical adenopathy.  Skin:    General: Skin is warm and dry.     Capillary Refill: Capillary refill takes less than 2 seconds.     Coloration: Skin is not jaundiced or pale.     Findings: No erythema.  Neurological:     Mental Status: She is alert and oriented to person, place, and time.  Psychiatric:        Behavior: Behavior is cooperative.      UC Treatments / Results  Labs (all labs ordered are listed, but only abnormal results are displayed) Labs Reviewed  POCT URINALYSIS DIP (MANUAL ENTRY) - Abnormal; Notable for the following components:      Result Value   Ketones, POC UA trace (5) (*)    Blood, UA large (*)     Protein Ur, POC =30 (*)    All other components within normal limits    EKG   Radiology No results found.  Procedures Procedures (including critical care time)  Medications Ordered in UC Medications - No data to display  Initial Impression / Assessment and Plan / UC Course  I have reviewed the triage vital signs and the nursing notes.  Pertinent labs & imaging results that were available during my care of the patient were reviewed by me and considered in my medical decision making (see chart for details).   Patient is well-appearing, normotensive, afebrile, not tachycardic, not tachypneic, oxygenating well on room air.    Essential hypertension Blood pressure recheck in urgent care today 128/86; she has a blood pressure log with her and has continued elevated blood pressures and other healthcare settings Amlodipine sent to pharmacy if patient checks blood pressure at home and is consistently greater than 130/80 Recommended following up with her primary care provider and primary care provider assistance initiated today  Acute bilateral low back pain without sciatica Treat with tizanidine, sciatic rehab stretches-handout given Urinalysis negative for signs of UTI and discussed this with the patient  The patient was given the opportunity to ask  questions.  All questions answered to their satisfaction.  The patient is in agreement to this plan.   Final Clinical Impressions(s) / UC Diagnoses   Final diagnoses:  Essential hypertension  Acute bilateral low back pain without sciatica     Discharge Instructions      Blood pressure today is 128/86.  Since you have had elevated readings in other settings, I recommend rechecking at home; a good goal be less than 130/80.  Please establish care with a primary care provider for further evaluation and management.  You can start the amlodipine if you feel your blood pressure is running higher than 130/80.  Start the tizanidine to help  with the pain in your back.  UA today is negative for a UTI today.     ED Prescriptions     Medication Sig Dispense Auth. Provider   amLODipine (NORVASC) 5 MG tablet Take 1 tablet (5 mg total) by mouth daily. 30 tablet Cathlean Marseilles A, NP   tiZANidine (ZANAFLEX) 4 MG tablet Take 1 tablet (4 mg total) by mouth every 8 (eight) hours as needed for muscle spasms. Do not take with alcohol or while driving or operating heavy machinery.  May cause drowsiness. 30 tablet Valentino Nose, NP      PDMP not reviewed this encounter.   Valentino Nose, NP 04/14/22 1214

## 2022-04-15 ENCOUNTER — Ambulatory Visit (INDEPENDENT_AMBULATORY_CARE_PROVIDER_SITE_OTHER): Payer: Medicaid Other | Admitting: Adult Health

## 2022-04-15 ENCOUNTER — Encounter: Payer: Self-pay | Admitting: Adult Health

## 2022-04-15 ENCOUNTER — Other Ambulatory Visit (HOSPITAL_COMMUNITY)
Admission: RE | Admit: 2022-04-15 | Discharge: 2022-04-15 | Disposition: A | Discharge status: Home / Self Care | Payer: Medicaid Other | Source: Ambulatory Visit | Attending: Adult Health | Admitting: Adult Health

## 2022-04-15 VITALS — BP 110/78 | HR 69 | Ht 69.0 in | Wt 216.5 lb

## 2022-04-15 DIAGNOSIS — Z0000 Encounter for general adult medical examination without abnormal findings: Secondary | ICD-10-CM | POA: Insufficient documentation

## 2022-04-15 DIAGNOSIS — R87810 Cervical high risk human papillomavirus (HPV) DNA test positive: Secondary | ICD-10-CM

## 2022-04-15 DIAGNOSIS — Z3202 Encounter for pregnancy test, result negative: Secondary | ICD-10-CM

## 2022-04-15 DIAGNOSIS — Z3009 Encounter for other general counseling and advice on contraception: Secondary | ICD-10-CM

## 2022-04-15 DIAGNOSIS — Z1329 Encounter for screening for other suspected endocrine disorder: Secondary | ICD-10-CM

## 2022-04-15 DIAGNOSIS — R61 Generalized hyperhidrosis: Secondary | ICD-10-CM | POA: Diagnosis not present

## 2022-04-15 DIAGNOSIS — Z01419 Encounter for gynecological examination (general) (routine) without abnormal findings: Secondary | ICD-10-CM

## 2022-04-15 DIAGNOSIS — Z113 Encounter for screening for infections with a predominantly sexual mode of transmission: Secondary | ICD-10-CM

## 2022-04-15 DIAGNOSIS — Z13220 Encounter for screening for lipoid disorders: Secondary | ICD-10-CM

## 2022-04-15 LAB — POCT URINE PREGNANCY: Preg Test, Ur: NEGATIVE

## 2022-04-15 NOTE — Progress Notes (Signed)
Patient ID: Jill Mosley, female   DOB: 1985/11/26, 37 y.o.   MRN: [id number] History of Present Illness: Jill Mosley is a 37 year old white female,single, G3P3 in for a well woman gyn exam and pap. She wants a tubal. She has fatigue and says BP runs hight at times, she is on meds. Having night sweats too. She needs PCP, has had Dr Valetta Close at [facility] refilling meds.    Current Medications, Allergies, Past Medical History, Past Surgical History, Family History and Social History were reviewed in [employer] record.     Review of Systems: Patient denies any headaches, hearing loss, blurred vision, shortness of breath, chest pain, abdominal pain, problems with bowel movements, urination, or intercourse. No joint pain or mood swings.  See HPI for positives.   Physical Exam:BP 110/78 (BP Location: Left Arm, Patient Position: Sitting, Cuff Size: Normal)   Pulse 69   Ht 5\' 9"  (1.753 m)   Wt 216 lb 8 oz (98.2 kg)   LMP 04/14/2022 (Exact Date)   BMI 31.97 kg/m  UPT is negative  General:  Well developed, well nourished, no acute distress Skin:  Warm and dry Neck:  Midline trachea, normal thyroid, good ROM, no lymphadenopathy Lungs; Clear to auscultation bilaterally Breast:  No dominant palpable mass, retraction, or nipple discharge Cardiovascular: Regular rate and rhythm Abdomen:  Soft, non tender, no hepatosplenomegaly Pelvic:  External genitalia is normal in appearance, no lesions.  The vagina is normal in appearance. Urethra has no lesions or masses. The cervix is bulbous.Pap with GC/CHL and HR HPV genotyping performed.  Uterus is felt to be normal size, shape, and contour.  No adnexal masses or tenderness noted.Bladder is non tender, no masses felt. Rectal:Deferred Extremities/musculoskeletal:  No swelling or varicosities noted, no clubbing or cyanosis Psych:  No mood changes, alert and cooperative,seems happy AA is 1 Fall risk is low    04/15/2022   11:49 AM  09/16/2020    3:47 PM  Depression screen PHQ 2/9  Decreased Interest 3 1  Down, Depressed, Hopeless 1 2  PHQ - 2 Score 4 3  Altered sleeping 3 2  Tired, decreased energy 3 3  Change in appetite 1 1  Feeling bad or failure about yourself  1 0  Trouble concentrating 1 3  Moving slowly or fidgety/restless 0 0  Suicidal thoughts 0 0  PHQ-9 Score 13 12       04/15/2022   11:50 AM 09/16/2020    3:47 PM  GAD 7 : Generalized Anxiety Score  Nervous, Anxious, on Edge 2 3  Control/stop worrying 3 3  Worry too much - different things 3 3  Trouble relaxing 3 2  Restless 1 3  Easily annoyed or irritable 2 1  Afraid - awful might happen 3 2  Total GAD 7 Score 17 17      Upstream - 04/15/22 1159       Pregnancy Intention Screening   Does the patient want to become pregnant in the next year? No    Does the patient's partner want to become pregnant in the next year? No    Would the patient like to discuss contraceptive options today? Yes      Contraception Wrap Up   Current Method Female Condom    End Method Female Condom   sign tubal papers today   Contraception Counseling Provided Yes             Examination chaperoned by Levy Pupa LPN  Impression and Plan: 1. Pregnancy examination or test, negative result - POCT urine pregnancy  2. Routine general medical examination at a health care facility Pap sent  Pap in 3 years if normal Will check labs Physical in 1 year - Cytology - PAP( [facility]) - CBC - Comprehensive metabolic panel - TSH - Lipid panel  3. Papanicolaou smear of cervix with positive high risk human papilloma virus (HPV) test Pap sent   4. Night sweats  5. Encounter for routine gynecological examination with Papanicolaou smear of cervix Pap sent   6. General counseling and advice for contraceptive management She wants tubal and papers signed today  Return about 05/18/22 with Dr Nelda Marseille for pre op   7. Screening cholesterol level - Lipid panel  8.  Screening for thyroid disorder - TSH  9. Screening examination for STD (sexually transmitted disease) - Hepatitis C antibody - HIV Antibody (routine testing w rflx) - RPR - Hepatitis B surface antigen

## 2022-04-16 LAB — HEPATITIS C ANTIBODY: Hep C Virus Ab: NONREACTIVE

## 2022-04-16 LAB — COMPREHENSIVE METABOLIC PANEL
ALT: 13 IU/L (ref 0–32)
AST: 14 IU/L (ref 0–40)
Albumin/Globulin Ratio: 2.1 (ref 1.2–2.2)
Albumin: 4.1 g/dL (ref 3.9–4.9)
Alkaline Phosphatase: 70 IU/L (ref 44–121)
BUN/Creatinine Ratio: 11 (ref 9–23)
BUN: 8 mg/dL (ref 6–20)
Bilirubin Total: 0.2 mg/dL (ref 0.0–1.2)
CO2: 22 mmol/L (ref 20–29)
Calcium: 9 mg/dL (ref 8.7–10.2)
Chloride: 105 mmol/L (ref 96–106)
Creatinine, Ser: 0.71 mg/dL (ref 0.57–1.00)
Globulin, Total: 2 g/dL (ref 1.5–4.5)
Glucose: 85 mg/dL (ref 70–99)
Potassium: 3.8 mmol/L (ref 3.5–5.2)
Sodium: 144 mmol/L (ref 134–144)
Total Protein: 6.1 g/dL (ref 6.0–8.5)
eGFR: 113 mL/min/{1.73_m2} (ref >59)

## 2022-04-16 LAB — CBC
Hematocrit: 44.3 % (ref 34.0–46.6)
Hemoglobin: 15.5 g/dL (ref 11.1–15.9)
MCH: 30.1 pg (ref 26.6–33.0)
MCHC: 35 g/dL (ref 31.5–35.7)
MCV: 86 fL (ref 79–97)
Platelets: 209 10*3/uL (ref 150–450)
RBC: 5.15 x10E6/uL (ref 3.77–5.28)
RDW: 12.9 % (ref 11.7–15.4)
WBC: 7.7 10*3/uL (ref 3.4–10.8)

## 2022-04-16 LAB — RPR: RPR Ser Ql: NONREACTIVE

## 2022-04-16 LAB — TSH: TSH: 1.48 u[IU]/mL (ref 0.450–4.500)

## 2022-04-16 LAB — LIPID PANEL
Chol/HDL Ratio: 8 ratio — ABNORMAL HIGH (ref 0.0–4.4)
Cholesterol, Total: 208 mg/dL — ABNORMAL HIGH (ref 100–199)
HDL: 26 mg/dL — ABNORMAL LOW (ref >39)
LDL Chol Calc (NIH): 144 mg/dL — ABNORMAL HIGH (ref 0–99)
Triglycerides: 210 mg/dL — ABNORMAL HIGH (ref 0–149)
VLDL Cholesterol Cal: 38 mg/dL (ref 5–40)

## 2022-04-16 LAB — HEPATITIS B SURFACE ANTIGEN: Hepatitis B Surface Ag: NEGATIVE

## 2022-04-16 LAB — HIV ANTIBODY (ROUTINE TESTING W REFLEX): HIV Screen 4th Generation wRfx: NONREACTIVE

## 2022-04-18 ENCOUNTER — Other Ambulatory Visit: Payer: Self-pay | Admitting: Adult Health

## 2022-04-18 DIAGNOSIS — E785 Hyperlipidemia, unspecified: Secondary | ICD-10-CM | POA: Insufficient documentation

## 2022-04-18 MED ORDER — ROSUVASTATIN CALCIUM 10 MG PO TABS: 10.0000 mg | ORAL_TABLET | Freq: Every day | ORAL | 3 refills | Status: DC

## 2022-04-20 LAB — CYTOLOGY - PAP
Chlamydia: NEGATIVE
Comment: NEGATIVE
Comment: NEGATIVE
Comment: NEGATIVE
Comment: NEGATIVE
Comment: NORMAL
HPV 16: POSITIVE — AB
HPV 18 / 45: NEGATIVE
High risk HPV: POSITIVE — AB
Neisseria Gonorrhea: NEGATIVE

## 2022-04-21 ENCOUNTER — Encounter: Payer: Self-pay | Admitting: Adult Health

## 2022-04-21 DIAGNOSIS — R87619 Unspecified abnormal cytological findings in specimens from cervix uteri: Secondary | ICD-10-CM | POA: Insufficient documentation

## 2022-04-22 ENCOUNTER — Encounter (HOSPITAL_COMMUNITY): Payer: Self-pay

## 2022-04-22 ENCOUNTER — Telehealth: Payer: Self-pay | Admitting: Adult Health

## 2022-04-22 NOTE — Telephone Encounter (Signed)
I have scheduled patient for the colpo with Mrs. Booker on Monday January 22. She is wanting to know the below.  Does this cause vomiting or back pain? Usually with the back pain I have I would get Prednisone and the muscle relaxers but my blood pressure was high as well.   If someone could respond to her on this matter that would be great.  Thanks, Terrence Dupont

## 2022-04-25 ENCOUNTER — Encounter: Payer: Medicaid Other | Admitting: Women's Health

## 2022-04-25 ENCOUNTER — Ambulatory Visit (INDEPENDENT_AMBULATORY_CARE_PROVIDER_SITE_OTHER): Payer: Medicaid Other | Admitting: Women's Health

## 2022-04-25 ENCOUNTER — Other Ambulatory Visit (HOSPITAL_COMMUNITY)
Admission: RE | Admit: 2022-04-25 | Discharge: 2022-04-25 | Disposition: A | Discharge status: Home / Self Care | Payer: Medicaid Other | Source: Ambulatory Visit | Attending: Women's Health | Admitting: Women's Health

## 2022-04-25 ENCOUNTER — Encounter: Payer: Self-pay | Admitting: Women's Health

## 2022-04-25 VITALS — BP 114/79 | HR 66 | Ht 69.0 in | Wt 216.0 lb

## 2022-04-25 DIAGNOSIS — R87618 Other abnormal cytological findings on specimens from cervix uteri: Secondary | ICD-10-CM | POA: Insufficient documentation

## 2022-04-25 DIAGNOSIS — Z3202 Encounter for pregnancy test, result negative: Secondary | ICD-10-CM

## 2022-04-25 LAB — POCT URINE PREGNANCY: Preg Test, Ur: NEGATIVE

## 2022-04-25 NOTE — Addendum Note (Signed)
Addended by: Janece Canterbury on: 04/25/2022 04:42 PM   Modules accepted: Orders

## 2022-04-25 NOTE — Patient Instructions (Signed)
Colposcopy, Care After  The following information offers guidance on how to care for yourself after your procedure. Your health care provider may also give you more specific instructions. If you have problems or questions, contact your health care provider. What can I expect after the procedure? If you had a colposcopy without a biopsy, you can expect to feel fine right away after your procedure. However, you may have some spotting of blood for a few days. You can return to your normal activities. If you had a colposcopy with a biopsy, it is common after the procedure to have: Soreness and mild pain. These may last for a few days. Mild vaginal bleeding or discharge that is dark-colored and grainy. This may last for a few days. The discharge may be caused by a liquid (solution) that was used during the procedure. You may need to wear a sanitary pad during this time. Spotting of blood for at least 48 hours after the procedure. Follow these instructions at home: Medicines Take over-the-counter and prescription medicines only as told by your health care provider. Talk with your health care provider about what type of over-the-counter pain medicines and prescription medicines you can start to take again. It is especially important to talk with your health care provider if you take blood thinners. Activity Avoid using douche products, using tampons, and having sex for at least 3 days after the procedure or for as long as told by your health care provider. Return to your normal activities as told by your health care provider. Ask your health care provider what activities are safe for you. General instructions Ask your health care provider if you may take baths, swim, or use a hot tub. You may take showers. If you use birth control (contraception), continue to use it. Keep all follow-up visits. This is important. Contact a health care provider if: You have a fever or chills. You faint or feel  light-headed. Get help right away if: You have heavy bleeding from your vagina or pass blood clots. Heavy bleeding is bleeding that soaks through a sanitary pad in less than 1 hour. You have vaginal discharge that is abnormal, is yellow in color, or smells bad. This could be a sign of infection. You have severe pain or cramps in your lower abdomen that do not go away with medicine. Summary If you had a colposcopy without a biopsy, you can expect to feel fine right away, but you may have some spotting of blood for a few days. You can return to your normal activities. If you had a colposcopy with a biopsy, it is common to have mild pain for a few days and spotting for 48 hours after the procedure. Avoid using douche products, using tampons, and having sex for at least 3 days after the procedure or for as long as told by your health care provider. Get help right away if you have heavy bleeding, severe pain, or signs of infection. This information is not intended to replace advice given to you by your health care provider. Make sure you discuss any questions you have with your health care provider. Document Revised: 08/16/2020 Document Reviewed: 08/16/2020 Elsevier Patient Education  2023 Elsevier Inc.  

## 2022-04-25 NOTE — Progress Notes (Signed)
   COLPOSCOPY PROCEDURE NOTE Patient name: Jill Mosley MRN [id number]  Date of birth: 1985/11/01 Subjective Findings:   Jill Mosley is a 37 y.o. G15P3003 Caucasian female being seen today for a colposcopy. Indication: Abnormal pap on 04/15/22: Atypical glandular cells w/ HRHPV positive: 16  Prior cytology:  Date Result Procedure  09/16/20 NILM w/ HRHPV positive: other (not 16, 18/45) None              Patient's last menstrual period was 04/14/2022 (exact date). Contraception: condoms, wants BTL. Menopausal: no. Hysterectomy: no.   Smoker: yes. Immunocompromised: no.   The risks and benefits were explained and informed consent was obtained, and written copy is in chart. Pertinent History Reviewed:   Reviewed past medical,surgical, social, obstetrical and family history.  Reviewed problem list, medications and allergies. Objective Findings & Procedure:   Vitals:   04/25/22 1601  BP: 114/79  Pulse: 66  Weight: 216 lb (98 kg)  Height: 5\' 9"  (1.753 m)  Body mass index is 31.9 kg/m.  Results for orders placed or performed in visit on 04/25/22 (from the past 24 hour(s))  POCT urine pregnancy   Collection Time: 04/25/22  3:59 PM  Result Value Ref Range   Preg Test, Ur Negative Negative     Time out was performed.  Speculum placed in the vagina, cervix fully visualized. SCJ: not fully visualized. Cervix swabbed x 3 with acetic acid.  Acetowhitening present: Yes Cervix: no visible lesions, no mosaicism, no punctation, no abnormal vasculature, and acetowhite lesion(s) noted at 12 & 6 o'clock. Endocervical curettage performed, Cervical biopsies taken at 12 & 6 o'clock, and Hemostasis achieved with Monsel's solution. Vagina: vaginal colposcopy not performed Vulva: vulvar colposcopy not performed  Specimens: 3  Complications: none  Chaperone: Celene Squibb    Colposcopic Impression & Plan:   Colposcopy findings consistent with LSIL Plan: Post biopsy instructions given, Will notify  patient of results when back, and Will base plan of care on pathology results and ASCCP guidelines Keep appt 2/14 w/ Dr. Nelda Marseille to discuss [place]  Return for As scheduled.  [place], [facility] 04/25/2022 4:32 PM

## 2022-04-27 LAB — SURGICAL PATHOLOGY

## 2022-05-02 ENCOUNTER — Other Ambulatory Visit: Payer: Self-pay | Admitting: Women's Health

## 2022-05-02 DIAGNOSIS — R87619 Unspecified abnormal cytological findings in specimens from cervix uteri: Secondary | ICD-10-CM

## 2022-05-03 ENCOUNTER — Other Ambulatory Visit: Payer: Medicaid Other

## 2022-05-11 ENCOUNTER — Telehealth: Payer: Self-pay | Admitting: *Deleted
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
## 2022-05-12 ENCOUNTER — Other Ambulatory Visit: Payer: Medicaid Other

## 2022-05-16 ENCOUNTER — Other Ambulatory Visit: Payer: Medicaid Other | Admitting: Obstetrics & Gynecology

## 2022-05-16 ENCOUNTER — Other Ambulatory Visit: Payer: Self-pay | Admitting: Nurse Practitioner

## 2022-05-17 NOTE — Telephone Encounter (Signed)
No longer under prescriber care.   Requested Prescriptions  Refused Prescriptions Disp Refills   amLODipine (NORVASC) 5 MG tablet [Pharmacy Med Name: amLODIPine Besylate 5 MG Oral Tablet] 30 tablet 0    Sig: Take 1 tablet by mouth once daily     Cardiovascular: Calcium Channel Blockers 2 Failed - 05/16/2022 11:08 PM      Failed - Valid encounter within last 6 months    Recent Outpatient Visits   None     Future Appointments             In 2 weeks Jill Mosley, [facility]            Passed - Last BP in normal range    BP Readings from Last 1 Encounters:  04/25/22 114/79         Passed - Last Heart Rate in normal range    Pulse Readings from Last 1 Encounters:  04/25/22 66

## 2022-05-18 ENCOUNTER — Telehealth (INDEPENDENT_AMBULATORY_CARE_PROVIDER_SITE_OTHER): Payer: Medicaid Other | Admitting: Obstetrics & Gynecology

## 2022-05-18 ENCOUNTER — Encounter: Payer: Self-pay | Admitting: Obstetrics & Gynecology

## 2022-05-18 DIAGNOSIS — F419 Anxiety disorder, unspecified: Secondary | ICD-10-CM

## 2022-05-18 DIAGNOSIS — Z3009 Encounter for other general counseling and advice on contraception: Secondary | ICD-10-CM | POA: Diagnosis not present

## 2022-05-18 DIAGNOSIS — R87810 Cervical high risk human papillomavirus (HPV) DNA test positive: Secondary | ICD-10-CM | POA: Diagnosis not present

## 2022-05-18 DIAGNOSIS — R87619 Unspecified abnormal cytological findings in specimens from cervix uteri: Secondary | ICD-10-CM

## 2022-05-18 NOTE — Progress Notes (Signed)
TELEHEALTH GYNECOLOGY VISIT ENCOUNTER NOTE  Provider location: [facility] for [place] [facility]   Patient location: Home  I connected with Jill Mosley on 05/18/22 at 11:10 AM EST by telephone and verified that I am speaking with the correct person using two identifiers. Patient was unable to do MyChart audiovisual encounter due to technical difficulties, she tried several times.    I discussed the limitations, risks, security and privacy concerns of performing an evaluation and management service by telephone and the availability of in person appointments. I also discussed with the patient that there may be a patient responsible charge related to this service. The patient expressed understanding and agreed to proceed.   History:  Jill Mosley is a 37 y.o. G12P3003 female being evaluated today for follow up regarding:   -Contraception: Not currently sexually active, but does not want another pregnancy.  In the past, she has just used condoms.  She has been on anything in the past as she is concerned about how it may impact her mood and anxiety.   Menses are regular each month- denies HMB or dysmenorrhea.  Denies intermenstrual bleeding.  She denies any abnormal vaginal discharge, bleeding, pelvic pain or other concerns.    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    Past Medical History:  Diagnosis Date   ADHD    Anxiety    Depression    Hypertension    Papanicolaou smear of cervix with positive high risk human papilloma virus (HPV) test 09/24/2020   09/24/20 repeat in 1 year per ASCCP guidelines, 5 year risk of CIN 3+ is 4.8%   PTSD (post-traumatic stress disorder)    Past Surgical History:  Procedure Laterality Date   APPENDECTOMY     CHOLECYSTECTOMY     WRIST FRACTURE SURGERY Right 2021   The following portions of the patient's history were reviewed and updated as appropriate: allergies,  current medications, past family history, past medical history, past social history, past surgical history and problem list.   Review of Systems:  Pertinent items noted in HPI and remainder of comprehensive ROS otherwise negative.  NSVD x 3  Physical Exam:   General:  Alert, oriented and cooperative.   Mental Status: Normal mood and affect perceived. Normal judgment and thought content.  Physical exam deferred due to nature of the encounter  Labs and Imaging No results found for this or any previous visit (from the past 336 hour(s)). No results found.    Assessment and Plan:     Contraceptive management: -reviewed long term options including Nexplanon, IUD or tubal ligation -Risks of procedure discussed with patient including but not limited to: risk of regret, permanence of method, bleeding, infection, injury to surrounding organs and need for additional procedures.  Failure risk of about 1% with increased risk of ectopic gestation if pregnancy occurs was also discussed with patient.  Also discussed possibility of post-tubal pain syndrome.   AGUS, HPV 16 -reviewed results and discussed recommendation for EMB -due to anxiety may consider EMB with premedication- Valium as long as someone else would be available to drive pt to appointment -notes that for dental procedure she was able to have completed with valium -alternatively could consider repeat pap in 3 mos- if still AGUS will need then to complete EMB- discussed pros/cons of this alternative screening   -also discussed completion of EMB at time of tubal ligation/salpingectomy if desires -question/concerns were  addressed and she was not ready to make a decision today  []$  plan to give pt a week to review and will plan to follow up on 2/27 for decision   I discussed the assessment and treatment plan with the patient. The patient was provided an opportunity to ask questions and all were answered. The patient agreed with the plan and  demonstrated an understanding of the instructions.   The patient was advised to call back or seek an in-person evaluation/go to the ED if the symptoms worsen or if the condition fails to improve as anticipated.  I provided 25 minutes of non-face-to-face time during this encounter.   Jill Mosley, [place] for [employer], [place]

## 2022-05-31 ENCOUNTER — Ambulatory Visit: Payer: Medicaid Other

## 2022-05-31 ENCOUNTER — Encounter: Payer: Self-pay | Admitting: Family Medicine

## 2022-05-31 ENCOUNTER — Other Ambulatory Visit: Payer: Self-pay | Admitting: Obstetrics & Gynecology

## 2022-05-31 ENCOUNTER — Ambulatory Visit: Payer: Medicaid Other | Admitting: Family Medicine

## 2022-05-31 VITALS — BP 128/86 | HR 83 | Ht 69.0 in | Wt 212.1 lb

## 2022-05-31 DIAGNOSIS — F419 Anxiety disorder, unspecified: Secondary | ICD-10-CM

## 2022-05-31 DIAGNOSIS — I10 Essential (primary) hypertension: Secondary | ICD-10-CM | POA: Insufficient documentation

## 2022-05-31 DIAGNOSIS — F32A Depression, unspecified: Secondary | ICD-10-CM

## 2022-05-31 DIAGNOSIS — Z3009 Encounter for other general counseling and advice on contraception: Secondary | ICD-10-CM

## 2022-05-31 DIAGNOSIS — E785 Hyperlipidemia, unspecified: Secondary | ICD-10-CM

## 2022-05-31 MED ORDER — AMLODIPINE BESYLATE 5 MG PO TABS: 5.0000 mg | ORAL_TABLET | Freq: Every day | ORAL | 1 refills | Status: AC

## 2022-05-31 MED ORDER — MISOPROSTOL 200 MCG PO TABS: ORAL_TABLET | ORAL | 0 refills | Status: DC

## 2022-05-31 MED ORDER — ROSUVASTATIN CALCIUM 10 MG PO TABS: 10.0000 mg | ORAL_TABLET | Freq: Every day | ORAL | 1 refills | Status: AC

## 2022-05-31 MED ORDER — DIAZEPAM 5 MG PO TABS: ORAL_TABLET | ORAL | 0 refills | Status: AC

## 2022-05-31 MED ORDER — ONDANSETRON HCL 4 MG PO TABS: 4.0000 mg | ORAL_TABLET | Freq: Three times a day (TID) | ORAL | 0 refills | Status: DC | PRN

## 2022-05-31 NOTE — Patient Instructions (Signed)
I appreciate the opportunity to provide care to you today!    Follow up:  3 months  Labs: next visit  Please continue to a heart-healthy diet and increase your physical activities. Try to exercise for 29mns at least five times a week.   Physical activity helps: Lower your blood glucose, improve your heart health, lower your blood pressure and cholesterol, burn calories to help manage her weight, gave you energy, lower stress, and improve his sleep.  The American diabetes Association (ADA) recommends being active for 2-1/2 hours (150 minutes) or more week.  Exercise for 30 minutes, 5 days a week (150 minutes total)     It was a pleasure to see you and I look forward to continuing to work together on your health and well-being. Please do not hesitate to call the office if you need care or have questions about your care.   Have a wonderful day and week. With Gratitude, GAlvira MondayMSN, FNP-BC

## 2022-05-31 NOTE — Progress Notes (Signed)
Rx sent in for premedication  Talked to patient over the phone- willing to try IUD and EMB with pre-meds.  She does not want a period and does not want future pregnancy

## 2022-05-31 NOTE — Assessment & Plan Note (Signed)
Refilled rosuvastatin 10 mg daily

## 2022-05-31 NOTE — Progress Notes (Signed)
New Patient Office Visit  Subjective:  Patient ID: Jill Mosley, female    DOB: 07/22/1985  Age: 37 y.o. MRN: [id number]  CC:  Chief Complaint  Patient presents with   Establish Care    New patient, needs to discuss depression/anxiety concerns. Already established with psych and counselor.     HPI Jill Mosley is a 37 y.o. female with past medical history of anxiety and depression presents for establishing care. For the details of today's visit, please refer to the assessment and plan.     Past Medical History:  Diagnosis Date   ADHD    Anxiety    Depression    Hypertension    Papanicolaou smear of cervix with positive high risk human papilloma virus (HPV) test 09/24/2020   09/24/20 repeat in 1 year per ASCCP guidelines, 5 year risk of CIN 3+ is 4.8%   PTSD (post-traumatic stress disorder)     Past Surgical History:  Procedure Laterality Date   APPENDECTOMY     CHOLECYSTECTOMY     WRIST FRACTURE SURGERY Right 2021    Family History  Problem Relation Age of Onset   Cancer Maternal Grandmother    Heart attack Maternal Grandfather    Diabetes Mother    Hypertension Mother    Hyperlipidemia Mother    Hypertension Maternal Aunt    Thyroid disease Cousin    ADD / ADHD Son    ODD Son    Other Daughter        lazy eye    Social History   Socioeconomic History   Marital status: Single    Spouse name: Not on file   Number of children: Not on file   Years of education: Not on file   Highest education level: Not on file  Occupational History   Not on file  Tobacco Use   Smoking status: Every Day    Packs/day: 1.00    Years: 16.00    Total pack years: 16.00    Types: Cigarettes   Smokeless tobacco: Never  Vaping Use   Vaping Use: Former  Substance and Sexual Activity   Alcohol use: Not Currently   Drug use: Not Currently   Sexual activity: Yes    Birth control/protection: Condom  Other Topics Concern   Not on file  Social History Narrative   Not on file    Social Determinants of Health   Financial Resource Strain: Medium Risk (04/15/2022)   Overall Financial Resource Strain (CARDIA)    Difficulty of Paying Living Expenses: Somewhat hard  Food Insecurity: No Food Insecurity (04/15/2022)   Hunger Vital Sign    Worried About Running Out of Food in the Last Year: Never true    Ran Out of Food in the Last Year: Never true  Transportation Needs: No Transportation Needs (04/15/2022)   PRAPARE - Hydrologist (Medical): No    Lack of Transportation (Non-Medical): No  Physical Activity: Insufficiently Active (04/15/2022)   Exercise Vital Sign    Days of Exercise per Week: 1 day    Minutes of Exercise per Session: 10 min  Stress: Stress Concern Present (04/15/2022)   [place]    Feeling of Stress : To some extent  Social Connections: Socially Isolated (04/15/2022)   Social Connection and Isolation Panel [NHANES]    Frequency of Communication with Friends and Family: More than three times a week    Frequency of Social Gatherings with  Friends and Family: More than three times a week    Attends Religious Services: Never    Marine scientist or Organizations: No    Attends Archivist Meetings: Never    Marital Status: Never married  Intimate Partner Violence: Not At Risk (04/15/2022)   Humiliation, Afraid, Rape, and Kick questionnaire    Fear of Current or Ex-Partner: No    Emotionally Abused: No    Physically Abused: No    Sexually Abused: No    ROS Review of Systems  Constitutional:  Negative for chills and fever.  Eyes:  Negative for visual disturbance.  Respiratory:  Negative for chest tightness and shortness of breath.   Neurological:  Negative for dizziness and headaches.  Psychiatric/Behavioral:  Negative for self-injury and suicidal ideas.     Objective:   Today's Vitals: BP 128/86 (BP Location: Left Arm)   Pulse 83   Ht  '5\' 9"'$  (1.753 m)   Wt 212 lb 1.9 oz (96.2 kg)   SpO2 96%   BMI 31.32 kg/m   Physical Exam HENT:     Head: Normocephalic.     Mouth/Throat:     Mouth: Mucous membranes are moist.  Cardiovascular:     Rate and Rhythm: Normal rate.     Heart sounds: Normal heart sounds.  Pulmonary:     Effort: Pulmonary effort is normal.     Breath sounds: Normal breath sounds.  Neurological:     Mental Status: She is alert.      Assessment & Plan:   Essential hypertension Assessment & Plan: Controlled She takes amlodipine 5 mg daily Patient is asymptomatic She requested a refill of her amlodipine 5 mg Refill sent to the pharmacy BP Readings from Last 3 Encounters:  05/31/22 128/86  04/25/22 114/79  04/15/22 110/78      Orders: -     amLODIPine Besylate; Take 1 tablet (5 mg total) by mouth daily.  Dispense: 90 tablet; Refill: 1  Anxiety and depression Assessment & Plan: She takes Prozac 40 mg daily and  Rexult 1 mg daily She is following up with her psychiatrist Dr. Claiborne Billings and Dr. Valetta Close for her Suboxone therapy She reports minimal relief of her symptoms with her current treatment regiment She denies suicidal ideation Encouraged to follow up with her psychiatrist for augmentation of her antianxiety medication Patient verbalized understanding    Dyslipidemia Assessment & Plan: Refilled rosuvastatin 10 mg daily  Orders: -     Rosuvastatin Calcium; Take 1 tablet (10 mg total) by mouth daily.  Dispense: 90 tablet; Refill: 1     Follow-up: Return in about 3 months (around 08/29/2022).   Jill Monday, FNP

## 2022-05-31 NOTE — Assessment & Plan Note (Addendum)
She takes Prozac 40 mg daily and  Rexult 1 mg daily She is following up with her psychiatrist Dr. Claiborne Billings and Dr. Valetta Close for her Suboxone therapy She reports minimal relief of her symptoms with her current treatment regiment She denies suicidal ideation Encouraged to follow up with her psychiatrist for augmentation of her antianxiety medication Patient verbalized understanding

## 2022-05-31 NOTE — Assessment & Plan Note (Signed)
Controlled She takes amlodipine 5 mg daily Patient is asymptomatic She requested a refill of her amlodipine 5 mg Refill sent to the pharmacy BP Readings from Last 3 Encounters:  05/31/22 128/86  04/25/22 114/79  04/15/22 110/78

## 2022-06-01 ENCOUNTER — Ambulatory Visit
Admission: RE | Admit: 2022-06-01 | Discharge: 2022-06-01 | Disposition: A | Discharge status: Home / Self Care | Payer: Medicaid Other | Source: Ambulatory Visit | Attending: Family Medicine | Admitting: Family Medicine

## 2022-06-01 ENCOUNTER — Telehealth: Payer: Self-pay | Admitting: Family Medicine

## 2022-06-01 VITALS — BP 164/100 | HR 66 | Temp 98.9°F | Resp 20

## 2022-06-01 DIAGNOSIS — F411 Generalized anxiety disorder: Secondary | ICD-10-CM

## 2022-06-01 HISTORY — DX: Bipolar disorder, unspecified: F31.9

## 2022-06-01 MED ORDER — HYDROXYZINE HCL 25 MG PO TABS: 25.0000 mg | ORAL_TABLET | Freq: Three times a day (TID) | ORAL | 0 refills | Status: DC | PRN

## 2022-06-01 NOTE — Telephone Encounter (Signed)
Spoke to pt on the phone let her know Peter Congo did not send Diazepam for her it looks like Jill Mosley sent this in for her. Pt made aware states understanding.

## 2022-06-01 NOTE — Discharge Instructions (Signed)
Call or message your primary care provider and request a new psychiatry referral.  I have sent over hydroxyzine that you may try at either 25 mg or 50 mg to see if this helps with sleep and anxiety.  Continue taking your other medications as discussed with your PCP

## 2022-06-01 NOTE — ED Provider Notes (Signed)
[facility]    CSN: [id number] Arrival date & time: 06/01/22  1447      History   Chief Complaint Chief Complaint  Patient presents with   Anxiety    HPI Jill Mosley is a 37 y.o. female.   Patient presenting today with acute on chronic anxiety and panic episodes.  She states for the past several weeks she has been crying off-and-on, having trouble sleeping and having frequent panic attacks.  There have been multiple trauma triggers in her life recently that have spread these on.  She just established with a new primary care provider yesterday and was instructed to continue on Rexulti and Prozac regimen.  She states she does best with benzos as needed when she has flareups with her anxiety like this but that she was told yesterday she could not have this because she was concurrently on Suboxone.  She states she does have a behavioral health specialist but they focus more on the addiction aspect and recovery rather than her typical mental health conditions.  She has requested from her PCP a referral to a new psychiatrist that we will manage these conditions for her.  Denies any suicidal or homicidal ideation at this time.    Past Medical History:  Diagnosis Date   ADHD    Anxiety    Bipolar 1 disorder ([place])    Depression    Hypertension    Papanicolaou smear of cervix with positive high risk human papilloma virus (HPV) test 09/24/2020   09/24/20 repeat in 1 year per ASCCP guidelines, 5 year risk of CIN 3+ is 4.8%   PTSD (post-traumatic stress disorder)     Patient Active Problem List   Diagnosis Date Noted   Anxiety and depression 05/31/2022   Essential hypertension 05/31/2022   Abnormal Pap smear of cervix 04/21/2022   Dyslipidemia 04/18/2022   Night sweats 04/15/2022   Menorrhagia with regular cycle 09/16/2020    Past Surgical History:  Procedure Laterality Date   APPENDECTOMY     CHOLECYSTECTOMY     WRIST FRACTURE SURGERY Right 2021    OB History      Gravida  3   Para  3   Term  3   Preterm      AB      Living  3      SAB      IAB      Ectopic      Multiple      Live Births  3            Home Medications    Prior to Admission medications   Medication Sig Start Date End Date Taking? Authorizing Provider  hydrOXYzine (ATARAX) 25 MG tablet Take 1-2 tablets (25-50 mg total) by mouth every 8 (eight) hours as needed. Will cause drowsiness 06/01/22  Yes Volney American, PA-C  albuterol (VENTOLIN HFA) 108 (90 Base) MCG/ACT inhaler Inhale 2 puffs into the lungs every 6 (six) hours as needed for wheezing or shortness of breath. 05/13/21   Jaynee Eagles, PA-C  amLODipine (NORVASC) 5 MG tablet Take 1 tablet (5 mg total) by mouth daily. 05/31/22   Alvira Monday, FNP  buprenorphine-naloxone (SUBOXONE) 8-2 mg SUBL SL tablet Place 0.5 tablets under the tongue 4 (four) times daily. 03/04/22   [provider]  cetirizine (ZYRTEC) 10 MG tablet Take 10 mg by mouth daily.    [provider]  cloNIDine (CATAPRES) 0.1 MG tablet Take 0.1 mg by mouth daily. Patient  not taking: Reported on 05/31/2022    [provider]  diazepam (VALIUM) 5 MG tablet Take one tablet 34mn prior to appointment Patient not taking: Reported on 05/31/2022 05/31/22   OJanyth Pupa DO  diphenhydrAMINE (BENADRYL) 25 MG tablet Take 25 mg by mouth daily as needed for allergies.    [provider]  FLUoxetine (PROZAC) 40 MG capsule Take 40 mg by mouth daily.    [provider]  gabapentin (NEURONTIN) 100 MG capsule Take 100 mg by mouth 3 (three) times daily. Patient not taking: Reported on 05/18/2022 03/10/22   [provider]  REXULTI 1 MG TABS tablet Take 1 mg by mouth daily. 05/11/22   [provider]  rosuvastatin (CRESTOR) 10 MG tablet Take 1 tablet (10 mg total) by mouth daily. 05/31/22   ZAlvira Monday FNP    Family History Family History  Problem Relation Age of Onset   Cancer Maternal  Grandmother    Heart attack Maternal Grandfather    Diabetes Mother    Hypertension Mother    Hyperlipidemia Mother    Hypertension Maternal Aunt    Thyroid disease Cousin    ADD / ADHD Son    ODD Son    Other Daughter        lazy eye    Social History Social History   Tobacco Use   Smoking status: Every Day    Packs/day: 1.00    Years: 16.00    Total pack years: 16.00    Types: Cigarettes   Smokeless tobacco: Never  Vaping Use   Vaping Use: Former  Substance Use Topics   Alcohol use: Not Currently   Drug use: Not Currently     Allergies   Elavil [amitriptyline], Reglan [metoclopramide], and Tramadol   Review of Systems Review of Systems PER HPI  Physical Exam Triage Vital Signs ED Triage Vitals  Enc Vitals Group     BP 06/01/22 1548 (!) 164/100     Pulse Rate 06/01/22 1548 66     Resp 06/01/22 1548 20     Temp 06/01/22 1548 98.9 F (37.2 C)     Temp Source 06/01/22 1548 Oral     SpO2 06/01/22 1548 95 %     Weight --      Height --      Head Circumference --      Peak Flow --      Pain Score 06/01/22 1551 0     Pain Loc --      Pain Edu? --      Excl. in [place] --    No data found.  Updated Vital Signs BP (!) 164/100 (BP Location: Right Arm)   Pulse 66   Temp 98.9 F (37.2 C) (Oral)   Resp 20   LMP 05/25/2022   SpO2 95%   Visual Acuity Right Eye Distance:   Left Eye Distance:   Bilateral Distance:    Right Eye Near:   Left Eye Near:    Bilateral Near:     Physical Exam Vitals and nursing note reviewed.  Constitutional:      Appearance: Normal appearance. She is not ill-appearing.  HENT:     Head: Atraumatic.  Eyes:     Extraocular Movements: Extraocular movements intact.     Conjunctiva/sclera: Conjunctivae normal.  Cardiovascular:     Rate and Rhythm: Normal rate and regular rhythm.     Heart sounds: Normal heart sounds.  Pulmonary:     Effort: Pulmonary effort is  normal.     Breath sounds: Normal breath sounds.   Musculoskeletal:        General: Normal range of motion.     Cervical back: Normal range of motion and neck supple.  Skin:    General: Skin is warm and dry.  Neurological:     Mental Status: She is alert and oriented to person, place, and time.     Motor: No weakness.     Gait: Gait normal.  Psychiatric:        Mood and Affect: Mood normal.        Thought Content: Thought content normal.        Judgment: Judgment normal.    UC Treatments / Results  Labs (all labs ordered are listed, but only abnormal results are displayed) Labs Reviewed - No data to display  EKG   Radiology No results found.  Procedures Procedures (including critical care time)  Medications Ordered in UC Medications - No data to display  Initial Impression / Assessment and Plan / UC Course  I have reviewed the triage vital signs and the nursing notes.  Pertinent labs & imaging results that were available during my care of the patient were reviewed by me and considered in my medical decision making (see chart for details).     Continue the Rexulti and Prozac per PCP and psychiatry, add hydroxyzine at bedtime and as needed for panic episodes and continue deep breathing exercises and other strategies.  She is awaiting a new psychiatry referral from her PCP at this time.  Discussed other resources such as a behavioral health 24-hour urgent care or the emergency department if symptoms continue to worsen.  Final Clinical Impressions(s) / UC Diagnoses   Final diagnoses:  Anxiety state     Discharge Instructions      Call or message your primary care provider and request a new psychiatry referral.  I have sent over hydroxyzine that you may try at either 25 mg or 50 mg to see if this helps with sleep and anxiety.  Continue taking your other medications as discussed with your PCP    ED Prescriptions     Medication Sig Dispense Auth. Provider   hydrOXYzine (ATARAX) 25 MG tablet Take 1-2 tablets (25-50  mg total) by mouth every 8 (eight) hours as needed. Will cause drowsiness 20 tablet Volney American, [place]      PDMP not reviewed this encounter.   Volney American, [place] 06/01/22 1652
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
## 2022-06-01 NOTE — Telephone Encounter (Signed)
Spoke to pt she thought Peter Congo had sent in Valium for her however she had a visit yesterday also with Janyth Pupa and that's why it was showing on her AVS, pt asking for a referral to Behavioral health.

## 2022-06-01 NOTE — Telephone Encounter (Signed)
The patient informed me that she is seeing Dr. Claiborne Billings, her psychiatrist

## 2022-06-01 NOTE — Telephone Encounter (Signed)
Patient called seen yesterday and patient thought Jill Mosley Monday was going to prescribe another RX for diazepam . Patient having a hard time with it Please return patient call.   [place], [place] - [address] [place], [place] [address] Phone: [phone]  Fax: [phone]

## 2022-06-02 ENCOUNTER — Encounter: Payer: Self-pay | Admitting: Radiology

## 2022-06-05 ENCOUNTER — Encounter: Payer: Self-pay | Admitting: Family Medicine

## 2022-06-06 ENCOUNTER — Other Ambulatory Visit: Payer: Self-pay

## 2022-06-06 ENCOUNTER — Telehealth: Payer: Self-pay | Admitting: Family Medicine

## 2022-06-06 DIAGNOSIS — F419 Anxiety disorder, unspecified: Secondary | ICD-10-CM

## 2022-06-06 NOTE — Telephone Encounter (Signed)
[facility] place a referral to Dr. Nehemiah Settle upstairs

## 2022-06-06 NOTE — Telephone Encounter (Signed)
Pt called back stating that she just got off the phone with [facility] they have not received the referral for her. She is wanting to know if that referral can please send the referral urgently?

## 2022-06-06 NOTE — Telephone Encounter (Signed)
Patient called said he came to conclusion believe that Dr Claiborne Billings psychiatrist will drop this doctor and needs to  see the providers at [facility] on the 2nd floor.  Patient went to ER and will come off of her medicine Suboxone and Dr Claiborne Billings will not prescribe no benzo medicine, Patient needs a correction to her referral needs a new one for [facility] in [place] needs a new referral.

## 2022-06-06 NOTE — Telephone Encounter (Signed)
Referral has been placed. 

## 2022-06-06 NOTE — Telephone Encounter (Signed)
[facility] an urgent referral to [facility]

## 2022-06-07 ENCOUNTER — Other Ambulatory Visit: Payer: Self-pay | Admitting: Obstetrics & Gynecology

## 2022-06-07 DIAGNOSIS — F419 Anxiety disorder, unspecified: Secondary | ICD-10-CM

## 2022-06-08 ENCOUNTER — Other Ambulatory Visit: Payer: Self-pay | Admitting: Family Medicine

## 2022-06-08 ENCOUNTER — Encounter: Payer: Self-pay | Admitting: *Deleted

## 2022-06-08 DIAGNOSIS — F419 Anxiety disorder, unspecified: Secondary | ICD-10-CM

## 2022-06-08 MED ORDER — HYDROXYZINE HCL 25 MG PO TABS: 25.0000 mg | ORAL_TABLET | Freq: Three times a day (TID) | ORAL | 0 refills | Status: AC | PRN

## 2022-06-08 MED ORDER — FLUOXETINE HCL 40 MG PO CAPS: 40.0000 mg | ORAL_CAPSULE | Freq: Every day | ORAL | 1 refills | Status: AC

## 2022-06-08 NOTE — Telephone Encounter (Signed)
I've refilled her prozac

## 2022-06-13 ENCOUNTER — Encounter: Payer: Self-pay | Admitting: Family Medicine

## 2022-06-14 ENCOUNTER — Encounter: Payer: Self-pay | Admitting: Family Medicine

## 2022-06-14 ENCOUNTER — Telehealth (INDEPENDENT_AMBULATORY_CARE_PROVIDER_SITE_OTHER): Payer: Medicaid Other | Admitting: Family Medicine

## 2022-06-14 DIAGNOSIS — F419 Anxiety disorder, unspecified: Secondary | ICD-10-CM

## 2022-06-14 DIAGNOSIS — F32A Depression, unspecified: Secondary | ICD-10-CM | POA: Diagnosis not present

## 2022-06-14 MED ORDER — MAGNESIUM 400 MG PO CAPS: 1.0000 | ORAL_CAPSULE | Freq: Every day | ORAL | 2 refills | Status: AC

## 2022-06-14 NOTE — Progress Notes (Signed)
Virtual Visit via Video Note  I connected with Jill Mosley on 06/14/22 at 10:40 AM EDT by a video enabled telemedicine application and verified that I am speaking with the correct person using two identifiers.  Patient Location: Home Provider Location: Home Office  I discussed the limitations, risks, security, and privacy concerns of performing an evaluation and management service by video and the availability of in person appointments. I also discussed with the patient that there may be a patient responsible charge related to this service. The patient expressed understanding and agreed to proceed.  Subjective: PCP: Alvira Monday, FNP  Chief Complaint  Patient presents with   Anxiety    Would like to discuss anxiety and depression.   HPI The patient is seen today with complaints of increased anxiety. She reports that her current medication regimen is ineffective, and she would like to be started on benzodiazepine. She reports buying Xanax on the streets--no reports of suicidal ideation and thoughts. Of note, the patient has a history of OCD, ADHD, and bipolar.   ROS: Per HPI  Current Outpatient Medications:    albuterol (VENTOLIN HFA) 108 (90 Base) MCG/ACT inhaler, Inhale 2 puffs into the lungs every 6 (six) hours as needed for wheezing or shortness of breath., Disp: 18 g, Rfl: 0   amLODipine (NORVASC) 5 MG tablet, Take 1 tablet (5 mg total) by mouth daily., Disp: 90 tablet, Rfl: 1   buprenorphine-naloxone (SUBOXONE) 8-2 mg SUBL SL tablet, Place 0.5 tablets under the tongue 4 (four) times daily., Disp: , Rfl:    cetirizine (ZYRTEC) 10 MG tablet, Take 10 mg by mouth daily., Disp: , Rfl:    cloNIDine (CATAPRES) 0.1 MG tablet, Take 0.1 mg by mouth daily., Disp: , Rfl:    diazepam (VALIUM) 5 MG tablet, Take one tablet 2mn prior to appointment, Disp: 1 tablet, Rfl: 0   diphenhydrAMINE (BENADRYL) 25 MG tablet, Take 25 mg by mouth daily as needed for allergies., Disp: , Rfl:     FLUoxetine (PROZAC) 40 MG capsule, Take 1 capsule (40 mg total) by mouth daily., Disp: 30 capsule, Rfl: 1   gabapentin (NEURONTIN) 100 MG capsule, Take 100 mg by mouth 3 (three) times daily., Disp: , Rfl:    hydrOXYzine (ATARAX) 25 MG tablet, Take 1-2 tablets (25-50 mg total) by mouth every 8 (eight) hours as needed. Will cause drowsiness, Disp: 20 tablet, Rfl: 0   Magnesium 400 MG CAPS, Take 1 tablet by mouth daily., Disp: 30 capsule, Rfl: 2   REXULTI 1 MG TABS tablet, Take 1 mg by mouth daily., Disp: , Rfl:    rosuvastatin (CRESTOR) 10 MG tablet, Take 1 tablet (10 mg total) by mouth daily., Disp: 90 tablet, Rfl: 1  Observations/Objective: There were no vitals filed for this visit. Physical Exam  Assessment and Plan: Anxiety and depression -     Magnesium; Take 1 tablet by mouth daily.  Dispense: 30 capsule; Refill: 2  Will follow-up on the referral placed to psychiatry Encouraged the patient to continue taking her current treatment regimen   Follow Up Instructions: No follow-ups on file.   I discussed the assessment and treatment plan with the patient. The patient was provided an opportunity to ask questions, and all were answered. The patient agreed with the plan and demonstrated an understanding of the instructions.   The patient was advised to call back or seek an in-person evaluation if the symptoms worsen or if the condition fails to improve as anticipated.  The above assessment  and management plan was discussed with the patient. The patient verbalized understanding of and has agreed to the management plan.   Alvira Monday, FNP

## 2022-06-19 IMAGING — DX DG WRIST COMPLETE 3+V*R*
3 series · 3 of 3 positions shown · non-contrast
Comparison: 02/04/2020

CLINICAL DATA: Wrist injury and pain.

EXAM:
RIGHT WRIST - COMPLETE 3+ VIEW

[wrist pa]
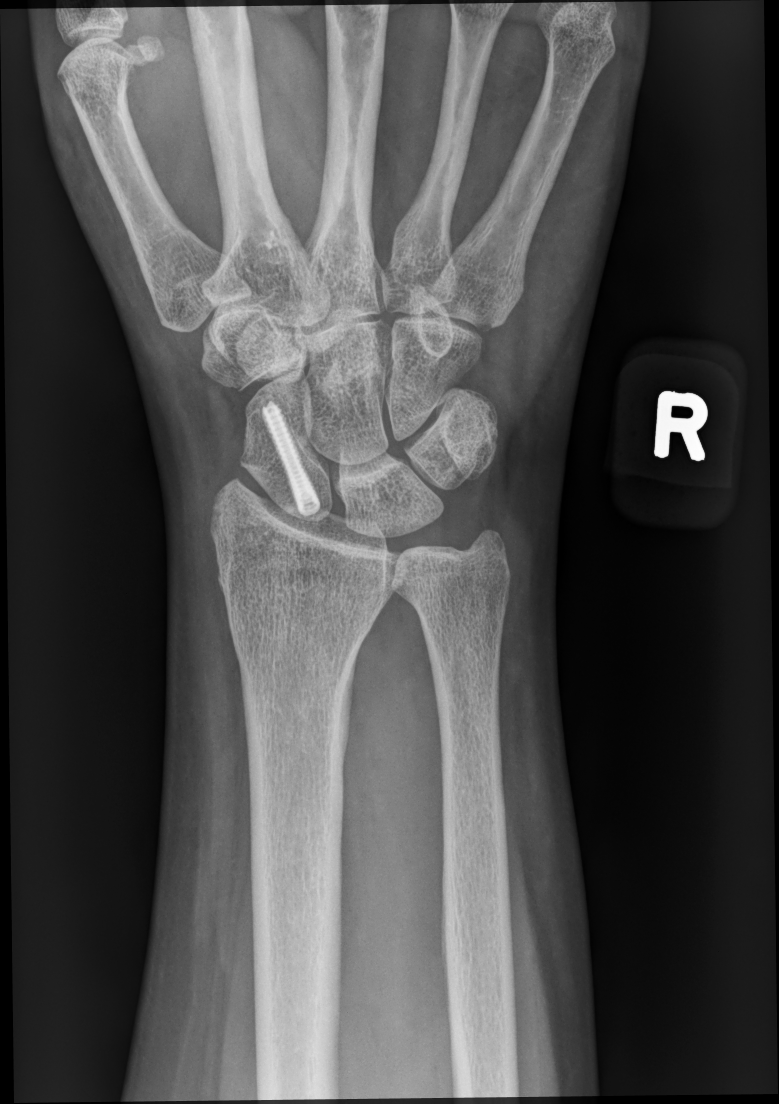

[wrist mlo]
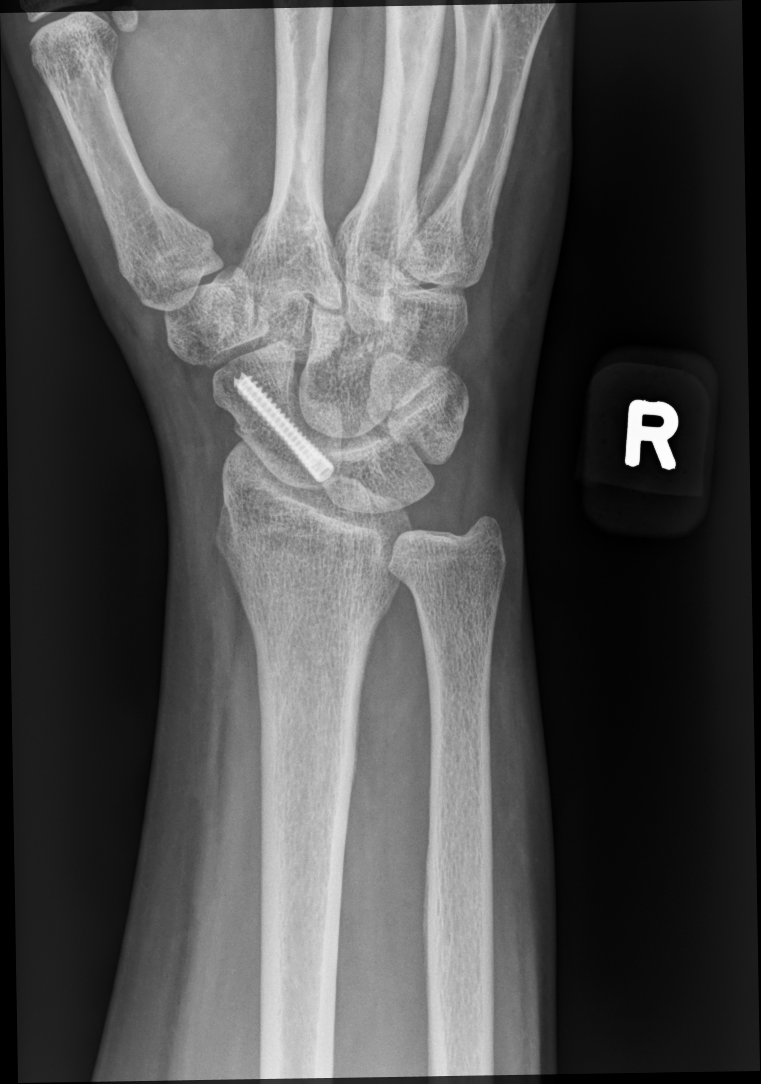

[wrist lat]
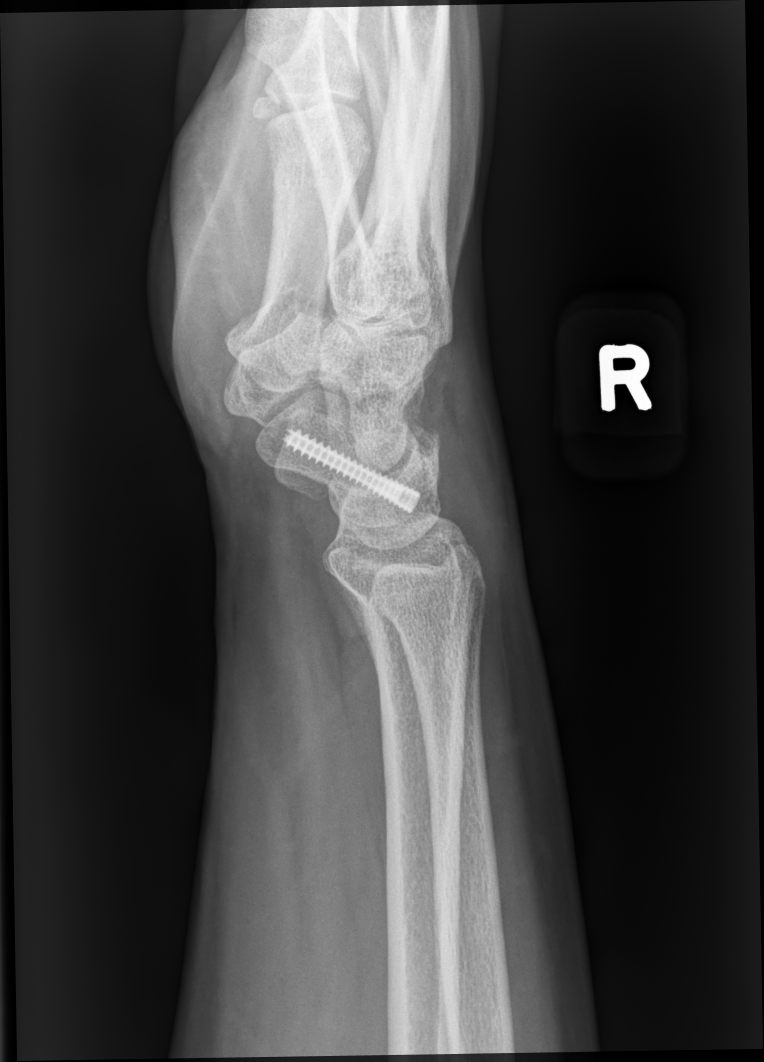

[3 of 3 positions shown; findings below may reference images not displayed]

FINDINGS: Patient is status post ORIF for scaphoid waist fracture. No evidence
for osteonecrosis in the scaphoid bone. No acute fracture evident.
No subluxation or dislocation.
IMPRESSION: Postsurgical changes in the scaphoid. No evidence for acute bony
abnormality.

## 2022-06-27 ENCOUNTER — Other Ambulatory Visit: Payer: Medicaid Other | Admitting: Obstetrics & Gynecology

## 2022-06-28 ENCOUNTER — Telehealth (INDEPENDENT_AMBULATORY_CARE_PROVIDER_SITE_OTHER): Payer: Medicaid Other | Admitting: Family Medicine

## 2022-06-28 ENCOUNTER — Encounter: Payer: Self-pay | Admitting: Family Medicine

## 2022-06-28 DIAGNOSIS — F32A Depression, unspecified: Secondary | ICD-10-CM | POA: Diagnosis not present

## 2022-06-28 DIAGNOSIS — F419 Anxiety disorder, unspecified: Secondary | ICD-10-CM

## 2022-06-28 NOTE — Progress Notes (Signed)
Virtual Visit via Video Note  I connected with Jill Mosley on 06/28/22 at 11:40 AM EDT by a video enabled telemedicine application and verified that I am speaking with the correct person using two identifiers.  Patient Location: Other:  In her Car Provider Location: Office/Clinic  I discussed the limitations, risks, security, and privacy concerns of performing an evaluation and management service by video and the availability of in person appointments. I also discussed with the patient that there may be a patient responsible charge related to this service. The patient expressed understanding and agreed to proceed.  Subjective: PCP: Alvira Monday, FNP  Chief Complaint  Patient presents with   Anxiety    Pt states she was previously on suboxene has been off of it for 2 weeks, needing a refill, reports being frustrated about seeing different providers states she would like to stick to one only to prescribe all medications.    HPI The patient is seeing Dr. Deon Pilling for her Subutex therapy and reports that Dr. Valetta Close has a no-benzo policy.  She reports that she has been off Subutex for 2 weeks and would like for her PCP to prescribe Subutex for her so that Dr. Valetta Close can prescribe benzo for her upcoming biopsy procedure.  Current Outpatient Medications:    albuterol (VENTOLIN HFA) 108 (90 Base) MCG/ACT inhaler, Inhale 2 puffs into the lungs every 6 (six) hours as needed for wheezing or shortness of breath., Disp: 18 g, Rfl: 0   amLODipine (NORVASC) 5 MG tablet, Take 1 tablet (5 mg total) by mouth daily., Disp: 90 tablet, Rfl: 1   buprenorphine-naloxone (SUBOXONE) 8-2 mg SUBL SL tablet, Place 0.5 tablets under the tongue 4 (four) times daily., Disp: , Rfl:    cetirizine (ZYRTEC) 10 MG tablet, Take 10 mg by mouth daily., Disp: , Rfl:    cloNIDine (CATAPRES) 0.1 MG tablet, Take 0.1 mg by mouth daily., Disp: , Rfl:    diazepam (VALIUM) 5 MG tablet, Take one tablet 25min prior to appointment,  Disp: 1 tablet, Rfl: 0   diphenhydrAMINE (BENADRYL) 25 MG tablet, Take 25 mg by mouth daily as needed for allergies., Disp: , Rfl:    FLUoxetine (PROZAC) 40 MG capsule, Take 1 capsule (40 mg total) by mouth daily., Disp: 30 capsule, Rfl: 1   gabapentin (NEURONTIN) 100 MG capsule, Take 100 mg by mouth 3 (three) times daily., Disp: , Rfl:    hydrOXYzine (ATARAX) 25 MG tablet, Take 1-2 tablets (25-50 mg total) by mouth every 8 (eight) hours as needed. Will cause drowsiness, Disp: 20 tablet, Rfl: 0   Magnesium 400 MG CAPS, Take 1 tablet by mouth daily., Disp: 30 capsule, Rfl: 2   REXULTI 1 MG TABS tablet, Take 1 mg by mouth daily., Disp: , Rfl:    rosuvastatin (CRESTOR) 10 MG tablet, Take 1 tablet (10 mg total) by mouth daily., Disp: 90 tablet, Rfl: 1  Observations/Objective: There were no vitals filed for this visit. Physical Exam Patient is well-developed, well-nourished in no acute distress.  Resting comfortably at home.  Head is normocephalic, atraumatic.  No labored breathing.  Speech is clear and coherent with logical content.  Patient is alert and oriented at baseline.   Assessment and Plan: Anxiety and depression -     Ambulatory referral to Psychiatry   Inform the patient I do not prescribe Subutex and to follow-up with Dr. Valetta Close Urgent referral placed to psychiatry She denies suicidal thoughts and ideation, but notes that her anxiety is extremely high  Follow Up Instructions: No follow-ups on file.   I discussed the assessment and treatment plan with the patient. The patient was provided an opportunity to ask questions, and all were answered. The patient agreed with the plan and demonstrated an understanding of the instructions.   The patient was advised to call back or seek an in-person evaluation if the symptoms worsen or if the condition fails to improve as anticipated.  The above assessment and management plan was discussed with the patient. The patient verbalized  understanding of and has agreed to the management plan.   Alvira Monday, FNP

## 2022-06-29 ENCOUNTER — Telehealth: Payer: Self-pay | Admitting: Family Medicine

## 2022-06-29 NOTE — Telephone Encounter (Signed)
Pt wants to know if she can speak to nurse in regards to the medication

## 2022-06-30 NOTE — Telephone Encounter (Signed)
Pt returned call  Can leave detailed vm

## 2022-06-30 NOTE — Telephone Encounter (Signed)
Lmtrc

## 2022-07-01 NOTE — Telephone Encounter (Signed)
Tried calling pt unable to reach her. 

## 2022-07-04 ENCOUNTER — Encounter: Payer: Medicaid Other | Admitting: Licensed Clinical Social Worker

## 2022-07-31 ENCOUNTER — Ambulatory Visit (INDEPENDENT_AMBULATORY_CARE_PROVIDER_SITE_OTHER): Payer: Medicaid Other

## 2022-07-31 ENCOUNTER — Ambulatory Visit
Admission: EM | Admit: 2022-07-31 | Discharge: 2022-07-31 | Disposition: A | Discharge status: Home / Self Care | Payer: Medicaid Other | Attending: Nurse Practitioner | Admitting: Nurse Practitioner

## 2022-07-31 DIAGNOSIS — S93402A Sprain of unspecified ligament of left ankle, initial encounter: Secondary | ICD-10-CM

## 2022-07-31 NOTE — ED Triage Notes (Signed)
Pt c/o foot/ankle injury, pt states she was standing on the curb and her foot slipped and fell, due to the curb being steep. Occurred 10 minutes ago. Pt states she heard a snapping sound and fell down, after she rolled her foot.

## 2022-07-31 NOTE — Discharge Instructions (Signed)
The x-ray of the left ankle is negative for fracture or dislocation.  Based on the mechanism of injury, it appears you have sprained the ankle. May take over-the-counter Tylenol or ibuprofen as needed for pain or discomfort. Wear the brace provided to provide compression and support.  Also wear the brace with strenuous activity. Gentle range of motion exercises to help reduce your recovery time. RICE therapy.  Rest, ice, compression, and elevation.  Apply ice for 20 minutes, remove for 1 hour, then repeat is much as possible.  Do this is much as she can for the next 48 hours. If symptoms do not improve over the next 1 to 2 weeks, it is recommended that he follow-up with orthopedics.  You can follow-up with [facility] at [phone] with [facility] at [phone]. Follow-up as needed.

## 2022-07-31 NOTE — ED Provider Notes (Signed)
[facility]    CSN: [id number] Arrival date & time: 07/31/22  1509      History   Chief Complaint No chief complaint on file.   HPI Jill Mosley is a 37 y.o. female.   The history is provided by the patient.   The patient presents for complaints of left ankle/foot pain that occurred shortly prior to her arrival today.  Patient states she stepped off a curb, and twisted the left ankle outward.  Since that time, she has been able to bear weight on the right foot/ankle.  She states that she did hear a snapping sound after she rolled her foot.  She denies numbness, tingling, or radiation of pain.  Patient has not taken any medication for her symptoms.  Patient denies any previous injury or trauma.  Past Medical History:  Diagnosis Date   ADHD    Anxiety    Bipolar 1 disorder (HCC)    Depression    Hypertension    Papanicolaou smear of cervix with positive high risk human papilloma virus (HPV) test 09/24/2020   09/24/20 repeat in 1 year per ASCCP guidelines, 5 year risk of CIN 3+ is 4.8%   PTSD (post-traumatic stress disorder)     Patient Active Problem List   Diagnosis Date Noted   Anxiety and depression 05/31/2022   Essential hypertension 05/31/2022   Abnormal Pap smear of cervix 04/21/2022   Dyslipidemia 04/18/2022   Night sweats 04/15/2022   Menorrhagia with regular cycle 09/16/2020    Past Surgical History:  Procedure Laterality Date   APPENDECTOMY     CHOLECYSTECTOMY     WRIST FRACTURE SURGERY Right 2021    OB History     Gravida  3   Para  3   Term  3   Preterm      AB      Living  3      SAB      IAB      Ectopic      Multiple      Live Births  3            Home Medications    Prior to Admission medications   Medication Sig Start Date End Date Taking? Authorizing Provider  albuterol (VENTOLIN HFA) 108 (90 Base) MCG/ACT inhaler Inhale 2 puffs into the lungs every 6 (six) hours as needed for wheezing or shortness of  breath. 05/13/21   Wallis Bamberg, PA-C  amLODipine (NORVASC) 5 MG tablet Take 1 tablet (5 mg total) by mouth daily. 05/31/22   Gilmore Laroche, FNP  buprenorphine-naloxone (SUBOXONE) 8-2 mg SUBL SL tablet Place 0.5 tablets under the tongue 4 (four) times daily. 03/04/22   [provider]  cetirizine (ZYRTEC) 10 MG tablet Take 10 mg by mouth daily.    [provider]  cloNIDine (CATAPRES) 0.1 MG tablet Take 0.1 mg by mouth daily.    [provider]  diazepam (VALIUM) 5 MG tablet Take one tablet prior to appointment 05/31/22   Myna Hidalgo, DO  diphenhydrAMINE (BENADRYL) 25 MG tablet Take 25 mg by mouth daily as needed for allergies.    [provider]  FLUoxetine (PROZAC) 40 MG capsule Take 1 capsule (40 mg total) by mouth daily. 06/08/22   Gilmore Laroche, FNP  gabapentin (NEURONTIN) 100 MG capsule Take 100 mg by mouth 3 (three) times daily. 03/10/22   [provider]  hydrOXYzine (ATARAX) 25 MG tablet Take 1-2 tablets (25-50 mg total) by mouth every  8 (eight) hours as needed. Will cause drowsiness 06/08/22   Gilmore Laroche, FNP  Magnesium 400 MG CAPS Take 1 tablet by mouth daily. 06/14/22   Gilmore Laroche, FNP  REXULTI 1 MG TABS tablet Take 1 mg by mouth daily. 05/11/22   [provider]  rosuvastatin (CRESTOR) 10 MG tablet Take 1 tablet (10 mg total) by mouth daily. 05/31/22   Gilmore Laroche, FNP    Family History Family History  Problem Relation Age of Onset   Cancer Maternal Grandmother    Heart attack Maternal Grandfather    Diabetes Mother    Hypertension Mother    Hyperlipidemia Mother    Hypertension Maternal Aunt    Thyroid disease Cousin    ADD / ADHD Son    ODD Son    Other Daughter        lazy eye    Social History Social History   Tobacco Use   Smoking status: Every Day    Packs/day: 1.00    Years: 16.00    Additional pack years: 0.00    Total pack years: 16.00    Types: Cigarettes   Smokeless tobacco: Never   Vaping Use   Vaping Use: Former  Substance Use Topics   Alcohol use: Not Currently   Drug use: Not Currently     Allergies   Elavil [amitriptyline], Reglan [metoclopramide], and Tramadol   Review of Systems Review of Systems Per HPI  Physical Exam Triage Vital Signs ED Triage Vitals  Enc Vitals Group     BP 07/31/22 1520 (!) 172/94     Pulse Rate 07/31/22 1520 (!) 127     Resp 07/31/22 1520 (!) 22     Temp 07/31/22 1520 98.5 F (36.9 C)     Temp Source 07/31/22 1520 Oral     SpO2 07/31/22 1520 97 %     Weight --      Height --      Head Circumference --      Peak Flow --      Pain Score 07/31/22 1523 10     Pain Loc --      Pain Edu? --      Excl. in GC? --    No data found.  Updated Vital Signs BP (!) 172/94 (BP Location: Right Arm)   Pulse (!) 127   Temp 98.5 F (36.9 C) (Oral)   Resp (!) 22   LMP 07/05/2022 (Exact Date)   SpO2 97%   Visual Acuity Right Eye Distance:   Left Eye Distance:   Bilateral Distance:    Right Eye Near:   Left Eye Near:    Bilateral Near:     Physical Exam Vitals and nursing note reviewed.  Constitutional:      General: She is not in acute distress.    Appearance: Normal appearance.  Eyes:     Extraocular Movements: Extraocular movements intact.     Pupils: Pupils are equal, round, and reactive to light.  Pulmonary:     Effort: Pulmonary effort is normal.  Musculoskeletal:     Left ankle: No swelling, deformity or ecchymosis. Tenderness present over the lateral malleolus. Decreased range of motion. Normal pulse.  Skin:    General: Skin is warm and dry.  Neurological:     General: No focal deficit present.     Mental Status: She is alert and oriented to person, place, and time.  Psychiatric:        Mood and Affect: Mood normal.  Behavior: Behavior normal.      UC Treatments / Results  Labs (all labs ordered are listed, but only abnormal results are displayed) Labs Reviewed - No data to  display  EKG   Radiology DG Ankle Complete Left  Result Date: 07/31/2022 CLINICAL DATA:  Ankle injury. EXAM: LEFT ANKLE COMPLETE - 3+ VIEW COMPARISON:  None Available. FINDINGS: There is no evidence of fracture, dislocation, or joint effusion. There is no evidence of arthropathy or other focal bone abnormality. Soft tissues are unremarkable. IMPRESSION: Negative. Electronically Signed   By: Ted Mcalpine M.D.   On: 07/31/2022 15:37    Procedures Procedures (including critical care time)  Medications Ordered in UC Medications - No data to display  Initial Impression / Assessment and Plan / UC Course  I have reviewed the triage vital signs and the nursing notes.  Pertinent labs & imaging results that were available during my care of the patient were reviewed by me and considered in my medical decision making (see chart for details).  The patient is well-appearing, she is in no acute distress, vital signs are stable.  X-ray of the left ankle is negative for fracture or dislocation.  Symptoms appear to be consistent with a sprain/strain of the left ankle.  Patient was provided an ankle brace to provide additional compression and support.  Supportive care recommendations were provided and discussed with the patient to include RICE therapy, over-the-counter analgesics for pain or discomfort, and gentle range of motion exercises of the left ankle.  Patient was advised if symptoms do not improve over the next 1 to 2 weeks, it is recommended that she follow-up with orthopedics for reevaluation.  Patient was given information for [facility] and for [facility].  Patient is in agreement with this plan of care and verbalizes understanding.  All questions were answered.  Patient stable for discharge.   Final Clinical Impressions(s) / UC Diagnoses   Final diagnoses:  Sprain of left ankle, unspecified ligament, initial encounter     Discharge Instructions      The x-ray of the  left ankle is negative for fracture or dislocation.  Based on the mechanism of injury, it appears you have sprained the ankle. May take over-the-counter Tylenol or ibuprofen as needed for pain or discomfort. Wear the brace provided to provide compression and support.  Also wear the brace with strenuous activity. Gentle range of motion exercises to help reduce your recovery time. RICE therapy.  Rest, ice, compression, and elevation.  Apply ice for 20 minutes, remove for 1 hour, then repeat is much as possible.  Do this is much as she can for the next 48 hours. If symptoms do not improve over the next 1 to 2 weeks, it is recommended that he follow-up with orthopedics.  You can follow-up with [facility] at [phone] with [facility] at [phone]. Follow-up as needed.    ED Prescriptions   None    PDMP not reviewed this encounter.   Abran Cantor, NP 07/31/22 [phone]

## 2022-08-01 ENCOUNTER — Other Ambulatory Visit: Payer: Medicaid Other | Admitting: Obstetrics & Gynecology

## 2022-08-01 ENCOUNTER — Other Ambulatory Visit: Payer: Self-pay | Admitting: Obstetrics & Gynecology

## 2022-08-01 DIAGNOSIS — F419 Anxiety disorder, unspecified: Secondary | ICD-10-CM

## 2022-09-02 ENCOUNTER — Ambulatory Visit: Payer: Medicaid Other | Admitting: Family Medicine
